# Patient Record
Sex: Female | Born: 1975 | Race: Black or African American | Hispanic: No | Marital: Single | Smoking: Never smoker
Health system: Southern US, Community
[De-identification: ages and names within clinical notes are randomized; demographics above are authoritative.]

## PROBLEM LIST (undated history)

## (undated) DIAGNOSIS — R739 Hyperglycemia, unspecified: Secondary | ICD-10-CM

---

## 2002-12-23 ENCOUNTER — Other Ambulatory Visit: Admission: RE | Admit: 2002-12-23 | Discharge: 2002-12-23 | Payer: Self-pay | Admitting: *Deleted

## 2003-02-11 ENCOUNTER — Observation Stay (HOSPITAL_COMMUNITY): Admission: RE | Admit: 2003-02-11 | Discharge: 2003-02-12 | Payer: Self-pay | Admitting: *Deleted

## 2003-02-11 ENCOUNTER — Encounter (INDEPENDENT_AMBULATORY_CARE_PROVIDER_SITE_OTHER): Payer: Self-pay | Admitting: *Deleted

## 2003-09-15 ENCOUNTER — Other Ambulatory Visit: Admission: RE | Admit: 2003-09-15 | Discharge: 2003-09-15 | Payer: Self-pay | Admitting: *Deleted

## 2004-01-06 ENCOUNTER — Other Ambulatory Visit: Admission: RE | Admit: 2004-01-06 | Discharge: 2004-01-06 | Payer: Self-pay | Admitting: *Deleted

## 2004-09-14 ENCOUNTER — Other Ambulatory Visit: Admission: RE | Admit: 2004-09-14 | Discharge: 2004-09-14 | Payer: Self-pay | Admitting: *Deleted

## 2005-04-14 ENCOUNTER — Other Ambulatory Visit: Admission: RE | Admit: 2005-04-14 | Discharge: 2005-04-14 | Payer: Self-pay | Admitting: *Deleted

## 2006-10-10 ENCOUNTER — Other Ambulatory Visit: Admission: RE | Admit: 2006-10-10 | Discharge: 2006-10-10 | Payer: Self-pay | Admitting: *Deleted

## 2007-09-19 ENCOUNTER — Emergency Department (HOSPITAL_COMMUNITY): Admission: EM | Admit: 2007-09-19 | Discharge: 2007-09-19 | Payer: Self-pay | Admitting: Physician Assistant

## 2010-07-04 ENCOUNTER — Emergency Department (HOSPITAL_COMMUNITY)
Admission: EM | Admit: 2010-07-04 | Discharge: 2010-07-05 | Disposition: A | Discharge status: Other Acute Inpatient Hospital | Payer: BC Managed Care – PPO | Source: Home / Self Care | Attending: Emergency Medicine | Admitting: Emergency Medicine

## 2010-07-04 ENCOUNTER — Encounter (HOSPITAL_COMMUNITY): Payer: Self-pay

## 2010-07-04 ENCOUNTER — Emergency Department (HOSPITAL_COMMUNITY): Payer: BC Managed Care – PPO

## 2010-07-04 DIAGNOSIS — D259 Leiomyoma of uterus, unspecified: Secondary | ICD-10-CM | POA: Insufficient documentation

## 2010-07-04 DIAGNOSIS — R188 Other ascites: Secondary | ICD-10-CM | POA: Insufficient documentation

## 2010-07-04 DIAGNOSIS — R109 Unspecified abdominal pain: Secondary | ICD-10-CM | POA: Insufficient documentation

## 2010-07-04 DIAGNOSIS — N921 Excessive and frequent menstruation with irregular cycle: Secondary | ICD-10-CM | POA: Insufficient documentation

## 2010-07-04 DIAGNOSIS — R310 Gross hematuria: Secondary | ICD-10-CM | POA: Insufficient documentation

## 2010-07-04 DIAGNOSIS — R300 Dysuria: Secondary | ICD-10-CM | POA: Insufficient documentation

## 2010-07-04 DIAGNOSIS — R112 Nausea with vomiting, unspecified: Secondary | ICD-10-CM | POA: Insufficient documentation

## 2010-07-04 DIAGNOSIS — R509 Fever, unspecified: Secondary | ICD-10-CM | POA: Insufficient documentation

## 2010-07-04 DIAGNOSIS — D72829 Elevated white blood cell count, unspecified: Secondary | ICD-10-CM | POA: Insufficient documentation

## 2010-07-04 DIAGNOSIS — R000 Tachycardia, unspecified: Secondary | ICD-10-CM | POA: Insufficient documentation

## 2010-07-04 DIAGNOSIS — N7093 Salpingitis and oophoritis, unspecified: Secondary | ICD-10-CM | POA: Insufficient documentation

## 2010-07-04 LAB — COMPREHENSIVE METABOLIC PANEL
AST: 23 U/L (ref 0–37)
BUN: 11 mg/dL (ref 6–23)
CO2: 21 mEq/L (ref 19–32)
Calcium: 8 mg/dL — ABNORMAL LOW (ref 8.4–10.5)
Creatinine, Ser: 0.68 mg/dL (ref 0.4–1.2)
GFR calc Af Amer: >60 mL/min (ref >60)
GFR calc non Af Amer: >60 mL/min (ref >60)
Glucose, Bld: 100 mg/dL — ABNORMAL HIGH (ref 70–99)

## 2010-07-04 LAB — DIFFERENTIAL
Basophils Absolute: 0 10*3/uL (ref 0.0–0.1)
Basophils Relative: 0 % (ref 0–1)
Eosinophils Relative: 0 % (ref 0–5)
Lymphocytes Relative: 2 % — ABNORMAL LOW (ref 12–46)
Lymphs Abs: 0.3 10*3/uL — ABNORMAL LOW (ref 0.7–4.0)
Neutro Abs: 14.4 10*3/uL — ABNORMAL HIGH (ref 1.7–7.7)
Neutrophils Relative %: 79 % — ABNORMAL HIGH (ref 43–77)
Promyelocytes Absolute: 0 %

## 2010-07-04 LAB — URINALYSIS, ROUTINE W REFLEX MICROSCOPIC
Ketones, ur: 40 mg/dL — AB
Protein, ur: NEGATIVE mg/dL
Urobilinogen, UA: 1 mg/dL (ref 0.0–1.0)

## 2010-07-04 LAB — POCT PREGNANCY, URINE: Preg Test, Ur: NEGATIVE

## 2010-07-04 LAB — CBC
Hemoglobin: 8.4 g/dL — ABNORMAL LOW (ref 12.0–15.0)
MCH: 20 pg — ABNORMAL LOW (ref 26.0–34.0)
MCHC: 31.6 g/dL (ref 30.0–36.0)

## 2010-07-04 LAB — WET PREP, GENITAL
Trich, Wet Prep: NONE SEEN
Yeast Wet Prep HPF POC: NONE SEEN

## 2010-07-04 MED ORDER — IOHEXOL 300 MG/ML  SOLN
100.0000 mL | Freq: Once | INTRAMUSCULAR | Status: AC | PRN
  Administered 2010-07-04: 100 mL via INTRAVENOUS

## 2010-07-05 ENCOUNTER — Inpatient Hospital Stay (HOSPITAL_COMMUNITY)
Admission: AD | Admit: 2010-07-05 | Discharge: 2010-07-13 | DRG: 573 | Disposition: A | Discharge status: Home / Self Care | Payer: BC Managed Care – PPO | Source: Other Acute Inpatient Hospital | Attending: Obstetrics and Gynecology | Admitting: Obstetrics and Gynecology

## 2010-07-05 DIAGNOSIS — N9489 Other specified conditions associated with female genital organs and menstrual cycle: Secondary | ICD-10-CM | POA: Diagnosis present

## 2010-07-05 DIAGNOSIS — D250 Submucous leiomyoma of uterus: Secondary | ICD-10-CM | POA: Diagnosis present

## 2010-07-05 DIAGNOSIS — N7093 Salpingitis and oophoritis, unspecified: Principal | ICD-10-CM | POA: Diagnosis present

## 2010-07-05 DIAGNOSIS — N801 Endometriosis of ovary: Secondary | ICD-10-CM | POA: Diagnosis present

## 2010-07-05 DIAGNOSIS — N739 Female pelvic inflammatory disease, unspecified: Secondary | ICD-10-CM | POA: Diagnosis present

## 2010-07-05 DIAGNOSIS — T8131XA Disruption of external operation (surgical) wound, not elsewhere classified, initial encounter: Secondary | ICD-10-CM | POA: Diagnosis present

## 2010-07-05 DIAGNOSIS — K5900 Constipation, unspecified: Secondary | ICD-10-CM | POA: Diagnosis present

## 2010-07-05 DIAGNOSIS — N80109 Endometriosis of ovary, unspecified side, unspecified depth: Secondary | ICD-10-CM | POA: Diagnosis present

## 2010-07-05 DIAGNOSIS — D649 Anemia, unspecified: Secondary | ICD-10-CM | POA: Diagnosis present

## 2010-07-05 LAB — GC/CHLAMYDIA PROBE AMP, GENITAL: Chlamydia, DNA Probe: NEGATIVE

## 2010-07-05 LAB — CBC
Hemoglobin: 8.2 g/dL — ABNORMAL LOW (ref 12.0–15.0)
MCH: 20 pg — ABNORMAL LOW (ref 26.0–34.0)
MCV: 64.3 fL — ABNORMAL LOW (ref 78.0–100.0)
RBC: 4.09 MIL/uL (ref 3.87–5.11)
WBC: 23.4 10*3/uL — ABNORMAL HIGH (ref 4.0–10.5)

## 2010-07-06 ENCOUNTER — Other Ambulatory Visit: Payer: Self-pay | Admitting: Obstetrics and Gynecology

## 2010-07-06 ENCOUNTER — Inpatient Hospital Stay (HOSPITAL_COMMUNITY): Payer: BC Managed Care – PPO

## 2010-07-06 LAB — COMPREHENSIVE METABOLIC PANEL
AST: 14 U/L (ref 0–37)
Albumin: 2.4 g/dL — ABNORMAL LOW (ref 3.5–5.2)
BUN: 3 mg/dL — ABNORMAL LOW (ref 6–23)
Calcium: 8.2 mg/dL — ABNORMAL LOW (ref 8.4–10.5)
Creatinine, Ser: 0.59 mg/dL (ref 0.4–1.2)
GFR calc Af Amer: >60 mL/min (ref >60)
Total Bilirubin: 0.9 mg/dL (ref 0.3–1.2)
Total Protein: 6.7 g/dL (ref 6.0–8.3)

## 2010-07-06 LAB — CBC
HCT: 28.6 % — ABNORMAL LOW (ref 36.0–46.0)
MCH: 21.9 pg — ABNORMAL LOW (ref 26.0–34.0)
MCV: 65 fL — ABNORMAL LOW (ref 78.0–100.0)
MCV: 67.9 fL — ABNORMAL LOW (ref 78.0–100.0)
Platelets: 273 10*3/uL (ref 150–400)
Platelets: 253 10*3/uL (ref 150–400)
RBC: 3.83 MIL/uL — ABNORMAL LOW (ref 3.87–5.11)
RDW: 18 % — ABNORMAL HIGH (ref 11.5–15.5)
RDW: 19.8 % — ABNORMAL HIGH (ref 11.5–15.5)
WBC: 15 10*3/uL — ABNORMAL HIGH (ref 4.0–10.5)
WBC: 18.6 10*3/uL — ABNORMAL HIGH (ref 4.0–10.5)

## 2010-07-06 LAB — DIFFERENTIAL
Basophils Absolute: 0 10*3/uL (ref 0.0–0.1)
Eosinophils Relative: 1 % (ref 0–5)
Monocytes Absolute: 0.8 10*3/uL (ref 0.1–1.0)
Monocytes Relative: 5 % (ref 3–12)
Neutrophils Relative %: 88 % — ABNORMAL HIGH (ref 43–77)

## 2010-07-06 LAB — DIC (DISSEMINATED INTRAVASCULAR COAGULATION)PANEL
D-Dimer, Quant: 14.18 ug/mL-FEU — ABNORMAL HIGH (ref 0.00–0.48)
Fibrinogen: >800 mg/dL — ABNORMAL HIGH (ref 204–475)
INR: 1.45 (ref 0.00–1.49)
Platelets: 280 10*3/uL (ref 150–400)
Prothrombin Time: 17.8 seconds — ABNORMAL HIGH (ref 11.6–15.2)

## 2010-07-07 LAB — COMPREHENSIVE METABOLIC PANEL
AST: 12 U/L (ref 0–37)
Albumin: 2.1 g/dL — ABNORMAL LOW (ref 3.5–5.2)
Alkaline Phosphatase: 61 U/L (ref 39–117)
Chloride: 107 mEq/L (ref 96–112)
GFR calc Af Amer: >60 mL/min (ref >60)
Potassium: 4.2 mEq/L (ref 3.5–5.1)
Total Bilirubin: 0.8 mg/dL (ref 0.3–1.2)

## 2010-07-07 LAB — DIFFERENTIAL
Eosinophils Absolute: 0 10*3/uL (ref 0.0–0.7)
Eosinophils Relative: 0 % (ref 0–5)
Monocytes Absolute: 0.5 10*3/uL (ref 0.1–1.0)
Neutrophils Relative %: 91 % — ABNORMAL HIGH (ref 43–77)

## 2010-07-07 LAB — CROSSMATCH
ABO/RH(D): O POS
Antibody Screen: NEGATIVE
Unit division: 0

## 2010-07-07 LAB — CBC
Platelets: 262 10*3/uL (ref 150–400)
RBC: 3.84 MIL/uL — ABNORMAL LOW (ref 3.87–5.11)
WBC: 15.5 10*3/uL — ABNORMAL HIGH (ref 4.0–10.5)

## 2010-07-07 NOTE — Op Note (Signed)
NAME:  Diane Chaney, Diane Chaney NO.:  [id number]  MEDICAL RECORD NO.:  [id number]           PATIENT TYPE:  I  LOCATION:  9372                          FACILITY:  [facility]  PHYSICIAN:  Randye Lobo, M.D.   DATE OF BIRTH:  [birth year]  DATE OF PROCEDURE:  07/06/2010 DATE OF DISCHARGE:                              OPERATIVE REPORT   PREOPERATIVE DIAGNOSIS:  Pelvic inflammatory disease with right tuboovarian abscess.  POSTOPERATIVE DIAGNOSES: 1. Pelvic inflammatory disease with right tuboovarian abscess. 2. Pelvic adhesions. 3. Obliterated cul-de-sac. 4. Peritonitis.  PROCEDURES: 1. Exploratory laparotomy. 2. Total abdominal hysterectomy with bilateral salpingo-oophorectomy. 3. Drainage of right tuboovarian abscess and intraperitoneal abscess. 4. Lysis of adhesions. 5. Cystoscopy.  SURGEON:  Randye Lobo, MD  ASSISTANT:  Miguel Aschoff, MD  ANESTHESIA:  General endotracheal.  INTRAVENOUS FLUIDS:  2100 mL of crystalloid.  TRANSFUSIONS:  2 units of packed red blood cells (560 mL).  URINE OUTPUT:  450 mL.  DRAINS:  One JP intraperitoneal drain of the cul-de-sac which was brought out through the vagina and one JP subcutaneous drain brought out through the lower abdominal skin.  INTRAOPERATIVE CULTURES:  Both aerobic and anaerobic cultures were taken of infected fluid from within the pelvis and the right tuboovarian abscess.  COMPLICATIONS:  None.  INDICATIONS FOR PROCEDURE:  The patient is a 35 year old, para 0, African American female who was admitted to the [facility] [place] early in the morning on July 05, 2010, after she presented to [facility] Emergency Department with abdominal pain and vomiting. The patient's history was significant for endometriosis, and several years ago she had undergone an exploratory laparotomy with a right ovarian cystectomy and extensive enterolysis.  CT scan in the emergency department documented a right tuboovarian  abscess and a normal appendix. The patient had an elevated white blood cell count of 15.6 and hemoglobin of 8.4.  The patient was admitted and treated as an inpatient for PID with a right tuboovarian abscess and she was begun on cefoxitin and doxycycline.  Initially, the patient had some improvement of her symptoms and pain, but by April 17, the patient became tachypneic, short of breath and her abdominal exam was exhibiting signs of rigidity and  progression of infection.  She also had spiked a fever  the evening prior to 102.6 degrees Fahrenheit.  Two sets of blood culture were drawn. A chest x-ray on April 17 demonstrated some bilateral  atelectasis with possible early left pneumonia.  The decision was made  to proceed at this time with exploratory laparotomy due to the declining  clinical presentation. She had a consent for an exploratory laparotomy with a right salpingo-oophorectomy and drainage of pelvic abscess along with a possible total abdominal hysterectomy and left salpingo-oophorectomy if necessary.  The patient declines future childbearing and she has agreed to this plan.  She was also consented for transfusion of 2 units of packed red blood cells.  Risks, benefits, and alternatives were discussed with the patient who indicated understanding and wished to proceed with surgery.  FINDINGS:  Surgery demonstrated a 7-8 cm right tuboovarian abscess which did open during the  surgical exploration.  There were multiple adhesions of the bilateral tubes and ovaries to the uterus.  The adnexa regions bilaterally were very distorted.  The cul-de-sac was obliterated with adhesions where the bowel appeared to be drawn up to the posterior lower uterine segment.  There was exudate which covered the upper and lower abdominal surfaces including the bowel.  The appendix was normal.  The small bowel was run and there was no sign of any intestinal disease.  In the upper abdomen, the liver and  gallbladder appeared to be normal. Both kidneys were palpated.  There was evidence of pus in each of the paracolic gutters.  Cystoscopy at the termination of the procedure demonstrated a normal bladder and urethra.  Both of the ureters were patent bilaterally.  The rectosigmoid colon was unremarkable.  SPECIMEN:  The uterus, cervix, bilateral tubes, and ovaries were sent to Pathology.  The patient also had intraperitoneal aerobic and anaerobic cultures from the fluid which was obtained during the initial exploration.  PROCEDURE:  The patient was escorted from her inpatient room down to the operating room.  The patient was just receiving a dose of IV doxycycline in the operating room.  The patient had been previously treated with subcutaneous heparin for DVT prophylaxis.  She did also receive PAS stockings at this time.  General endotracheal anesthesia was induced and the patient was placed in the supine position.  The abdomen and vagina were then sterilely prepped and a Foley catheter was placed.  She was sterilely draped.  A vertical midline incision was created with a scalpel and carried down to the fascia using a scalpel and monopolar cautery for hemostasis.  The fascia was incised vertically.  The parietal peritoneum was entered sharply and the incision was extended cranially and caudally.  Fluid was taken at this time for aerobic and anaerobic cultures.  The pelvis was irrigated and suctioned.  The procedure began with lysis of adhesions of the bilateral adnexal regions to the uterus.  The dissection was bloody and the tissue was all very friable.  The right infundibulopelvic ligament was identified.  It was doubly clamped and divided and then was ligated with sutures of 0 Vicryl.  Eventually, the right round ligament could be identified and it was similarly grasped and was bisected with monopolar cautery.  The dissection continued through the broad ligament very carefully on  this side in order to get down to the level of the uterine arteries.  The same procedure that was performed on the right- hand side was repeated on the left.  Care was taken to stay very high along the infundibulopelvic ligaments in order to avoid any contact with the ureters.  The bladder flap was taken down.  The uterine arteries were very carefully dissected out and were clamped, bisected, and suture ligated with 0 Vicryl.  A supracervical hysterectomy needed to be performed in order to safely remove the fundus to gain access into the posterior cul-de-sac.  This was performed.  The rectovaginal septum was then sharply dissected in order to gain access properly to the cervix posteriorly.  Again, the bladder was further dissected down.  The remaining cardinal ligaments were clamped, divided, and suture ligated with 0 Vicryl.  Eventually, the left uterosacral ligament could be clamped, divided, and sutured. This allowed access into the vaginal cuff.  The cervix was circumscribed with a Jorgenson scissors.  Angle sutures were created with 0 Vicryl bilaterally.  The vaginal cuff was then closed with interrupted sutures of  0 Vicryl, but a small space was left for placement of a JP drain.  The cul-de-sac was then irrigated and suctioned.  The dissection of the rectovaginal septum was reapproximated with a running suture of 2-0 Vicryl.  There was some oozing along the right posterior vagina and this responded to figure-of-eight sutures of 2-0 Vicryl.  The patient was placed in stirrups and cystoscopy was performed at this time after the injection of indigo carmine dye IV. The findings were as noted above.  The abdomen and pelvis were then copiously irrigated with warm irrigation and were suctioned.  All of the pedicles were reexamined at this time and hemostasis was excellent and the abdomen was therefore closed.  The self-retaining retractor and lap pads which had been placed were all  removed.  Instrument counts were correct and the abdomen was closed.  The parietal peritoneum was closed with a running suture of 2-0 Vicryl.  The fascia was closed with a running suture of looped 0 PDS. The subcutaneous layer was then irrigated and suctioned and a JP drain was placed in the subcutaneous layer and brought out through the skin in the left lower quadrant. It was sutured to the skin with 2-0 silk suture. An interrupted layer of 2-0 plain gut suture was then placed in the  subcutaneous layer, and the skin was closed with staples.  A sterile  pressure bandage was placed over the abdominal incision.  The patient was awakened and extubated and escorted to recovery in stable condition.  There were no complications to the procedure.  All needle, instrument, and sponge counts were correct.     Randye Lobo, M.D.     BES/MEDQ  D:  07/06/2010  T:  07/07/2010  Job:  [id number]  Electronically Signed by Conley Simmonds M.D. on 07/07/2010 11:31:37 PM

## 2010-07-08 LAB — DIFFERENTIAL
Basophils Absolute: 0.1 10*3/uL (ref 0.0–0.1)
Eosinophils Absolute: 0.2 10*3/uL (ref 0.0–0.7)
Eosinophils Absolute: 0.4 10*3/uL (ref 0.0–0.7)
Eosinophils Relative: 2 % (ref 0–5)
Lymphocytes Relative: 22 % (ref 12–46)
Lymphs Abs: 2 10*3/uL (ref 0.7–4.0)
Lymphs Abs: 2.5 10*3/uL (ref 0.7–4.0)
Monocytes Absolute: 0.9 10*3/uL (ref 0.1–1.0)
Neutro Abs: 7.3 10*3/uL (ref 1.7–7.7)

## 2010-07-08 LAB — CBC
MCH: 21.6 pg — ABNORMAL LOW (ref 26.0–34.0)
MCHC: 31.6 g/dL (ref 30.0–36.0)
MCHC: 32.1 g/dL (ref 30.0–36.0)
MCV: 66.9 fL — ABNORMAL LOW (ref 78.0–100.0)
Platelets: 287 10*3/uL (ref 150–400)
Platelets: 322 10*3/uL (ref 150–400)
RBC: 3.98 MIL/uL (ref 3.87–5.11)
RDW: 20.2 % — ABNORMAL HIGH (ref 11.5–15.5)
RDW: 20.4 % — ABNORMAL HIGH (ref 11.5–15.5)
WBC: 12.5 10*3/uL — ABNORMAL HIGH (ref 4.0–10.5)

## 2010-07-08 LAB — COMPREHENSIVE METABOLIC PANEL
AST: 20 U/L (ref 0–37)
Albumin: 2.4 g/dL — ABNORMAL LOW (ref 3.5–5.2)
Calcium: 8.3 mg/dL — ABNORMAL LOW (ref 8.4–10.5)
Creatinine, Ser: 0.67 mg/dL (ref 0.4–1.2)
GFR calc Af Amer: >60 mL/min (ref >60)
Total Protein: 6.1 g/dL (ref 6.0–8.3)

## 2010-07-09 LAB — DIFFERENTIAL
Basophils Relative: 1 % (ref 0–1)
Lymphocytes Relative: 18 % (ref 12–46)
Monocytes Relative: 9 % (ref 3–12)
Neutro Abs: 6.6 10*3/uL (ref 1.7–7.7)
Neutrophils Relative %: 68 % (ref 43–77)

## 2010-07-09 LAB — CBC
HCT: 26.6 % — ABNORMAL LOW (ref 36.0–46.0)
MCV: 67 fL — ABNORMAL LOW (ref 78.0–100.0)
Platelets: 323 10*3/uL (ref 150–400)
RBC: 3.97 MIL/uL (ref 3.87–5.11)
WBC: 9.7 10*3/uL (ref 4.0–10.5)

## 2010-07-10 LAB — BODY FLUID CULTURE: Culture: NO GROWTH

## 2010-07-11 LAB — CULTURE, BLOOD (ROUTINE X 2)
Culture  Setup Time: 201204162317
Culture: NO GROWTH

## 2010-07-12 LAB — CBC
HCT: 28.7 % — ABNORMAL LOW (ref 36.0–46.0)
Hemoglobin: 9 g/dL — ABNORMAL LOW (ref 12.0–15.0)
MCHC: 31.4 g/dL (ref 30.0–36.0)
RBC: 4.24 MIL/uL (ref 3.87–5.11)

## 2010-07-12 LAB — BASIC METABOLIC PANEL
CO2: 26 mEq/L (ref 19–32)
Calcium: 8.6 mg/dL (ref 8.4–10.5)
Chloride: 103 mEq/L (ref 96–112)
GFR calc Af Amer: >60 mL/min (ref >60)
Glucose, Bld: 99 mg/dL (ref 70–99)
Sodium: 135 mEq/L (ref 135–145)

## 2010-07-30 ENCOUNTER — Emergency Department (HOSPITAL_COMMUNITY)
Admission: EM | Admit: 2010-07-30 | Discharge: 2010-07-31 | Disposition: A | Discharge status: Home / Self Care | Payer: BC Managed Care – PPO | Attending: Emergency Medicine | Admitting: Emergency Medicine

## 2010-07-30 DIAGNOSIS — K5900 Constipation, unspecified: Secondary | ICD-10-CM | POA: Insufficient documentation

## 2010-07-31 ENCOUNTER — Emergency Department (HOSPITAL_COMMUNITY): Payer: BC Managed Care – PPO

## 2010-07-31 LAB — URINALYSIS, ROUTINE W REFLEX MICROSCOPIC
Glucose, UA: NEGATIVE mg/dL
Hgb urine dipstick: NEGATIVE
Protein, ur: NEGATIVE mg/dL
Specific Gravity, Urine: 1.026 (ref 1.005–1.030)
pH: 5.5 (ref 5.0–8.0)

## 2010-08-06 NOTE — Op Note (Signed)
NAME:  Diane Chaney, Diane Chaney                       ACCOUNT NO.:  [id number]   MEDICAL RECORD NO.:  [id number]                   PATIENT TYPE:  OBV   LOCATION:  0443                                 FACILITY:  [facility]   PHYSICIAN:  Almedia Balls. Fore, M.D.                DATE OF BIRTH:  [birth year]   DATE OF PROCEDURE:  02/11/2003  DATE OF DISCHARGE:                                 OPERATIVE REPORT   PREOPERATIVE DIAGNOSES:  Pelvic mass, probable fibroids, pelvic pain.   POSTOPERATIVE DIAGNOSES:  Right ovarian endometrioma, extensive pelvic  adhesions.   OPERATION:  Exploratory laparotomy with right ovarian cystectomy, extensive  enterolysis.   ANESTHESIA:  General oral tracheal.   SURGEON:  Almedia Balls. Randell Patient, M.D.   FIRST ASSISTANT:  Leona Singleton, M.D.   INDICATIONS FOR PROCEDURE:  The patient is a 35 year old with large pelvic  mass which appears to be closely approximated to the uterus and was felt to  be a myoma. The patient had severe pain on palpation with this mass, and is  admitted for exploratory laparotomy  and probable myomectomy this time. She  has been fully counseled as to the nature of the procedure and the risks  involved to include risk of anesthesia, injury to uterus, tubes, ovaries,  bowel, bladder, blood vessels, ureters, postoperative hemorrhage, infection,  recuperation, possible removal of the uterus, tubes and ovaries. She fully  understands all these considerations and has signed informed consent to  proceed on February 11, 2003.   FINDINGS:  On entry into the abdomen, it was found that the mass was a large  ovarian mass involving the right ovary. There were extensive adhesions  involving loops of bowel to the uterus, tubes, ovaries, and pelvic  peritoneum as well. Exploration of the upper abdomen revealed the lower  liver edge, gallbladder, spleen, kidneys, and periaortic areas to be normal  to palpation and the appendix to be normal to visualization and the  appendix  to be normal to visualization and palpation.   DESCRIPTION OF PROCEDURE:  With the patient under general anesthesia,  prepared and draped in the usual sterile fashion, with a Foley catheter in  the bladder, a lower abdominal transverse incision was made and carried into  the peritoneal cavity. After noting that this was an ovarian mass, pelvic  washings were taken. The mass was elevated out of the pelvis and during this  procedure, the mass was ruptured with a large amount of chocolate material  being extruded. It was felt therefore this was an endometrioma. The area was  lavaged with copious amounts of lactated Ringer's solution, and the cyst  ruptured area was opened further so that the cyst wall could be grasped and  dissected away from the cortex of the ovary. This was accomplished very  carefully and required quite a long time to perform safely. This was  accomplished. Portions of the ovary had to be sacrificed because  they were  quite thin. Moderate to a large amount of hemorrhage was encountered in the  ovarian hilum which responded to over sewing with interrupted figure-of-  eight sutures of #1 chromic catgut. Then the ovarian cortex was removed, and  the ovary was reapproximated and rendered hemostatic with continuous through  and through sutures of 3-0 PDS. The cyst wall incision site was closed with  a baseball type suture of 3-0 PDS. The numerous pelvic adhesions were  excised using sharp dissection and Bovie electrocoagulation. A number of  adhesions on the uterus were likewise removed as well as those on the left  adnexal structures. The area was lavaged with copious amounts of lactated  Ringer's solution, and after noting that hemostasis was maintained and that  sponge and instrument counts were correct, the peritoneum was closed with a  continuous suture of #0 Vicryl. The fascia was closed with two sutures of #0  Vicryl which were brought from the lateral aspect  of the incision and tied  separately in the midline. The subcutaneous fat was reapproximated with  interrupted horizontal mattress sutures of #0 Vicryl. The skin was closed  with a subcuticular suture of 3-0 plain catgut. Estimated blood loss 600 mL.  The patient was taken to the recovery room in good condition with clear  urine and a Foley catheter tubing. She will be placed on 23 hour observation  following surgery.                                               Almedia Balls. Randell Patient, M.D.    SRF/MEDQ  D:  02/11/2003  T:  02/11/2003  Job:  [id number]   cc:   Leona Singleton, M.D.  [address]  [place]  [place] [address]  Fax: [phone]

## 2010-08-06 NOTE — Discharge Summary (Signed)
NAME:  Diane Chaney, Diane Chaney                       ACCOUNT NO.:  [id number]   MEDICAL RECORD NO.:  [id number]                   PATIENT TYPE:  OBV   LOCATION:  0443                                 FACILITY:  [facility]   PHYSICIAN:  Almedia Balls. Fore, M.D.                DATE OF BIRTH:  Sep 12, 1975   DATE OF ADMISSION:  02/11/2003  DATE OF DISCHARGE:  02/12/2003                                 DISCHARGE SUMMARY   HISTORY:  The patient is a 35 year old with abdominopelvic pain and a large  pelvic mass felt to be fibroids.  She was admitted for possible myomectomy  on February 11, 2003.  The remainder of her history and physical are as  previously dictated.   LABORATORY DATA:  Preoperative hemoglobin 12.2.   HOSPITAL COURSE:  The patient was taken to the operating room on the morning  of February 11, 2003, at which time it was found that she had extensive  endometriosis and a large very firm endometrioma of the right ovary.  She  underwent a right ovarian cystectomy and extensive enterolysis and removal  of adhesions.  She did well postoperatively.  Diet and ambulation were  progressed over the evening of November 23 and early morning of February 12, 2003.  On the morning of February 12, 2003, she was afebrile and  experiencing no problems except pain, which was controlled by analgesics.  It was felt that she could be discharged at this time.   FINAL DIAGNOSES:  1. Right ovarian cyst, probable endometrioma.  2. Extensive pelvic adhesions.   OPERATIONS:  Right ovarian cystectomy, extensive enterolysis, pathology  report unavailable at the time of dictation.   DISPOSITION:  Discharged home, to return to the office in two weeks for  follow-up.  She was instructed to gradually progress her activities over  several weeks at home and to limit lifting and driving for two weeks.  She  was fully ambulatory, on a regular diet, and in good condition at the time  of discharge.  She was given a  prescription for [facility] #30 to be  taken 1 or 2 q.4h. p.r.n. pain and doxycycline 100 mg, #12, to be taken 1  b.i.d.                                               Almedia Balls. Randell Patient, M.D.    SRF/MEDQ  D:  02/12/2003  T:  02/12/2003  Job:  [id number]

## 2010-08-06 NOTE — H&P (Signed)
NAME:  Diane Chaney, Diane Chaney NO.:  [id number]   MEDICAL RECORD NO.:  [id number]                   PATIENT TYPE:   LOCATION:                                       FACILITY:   PHYSICIAN:  Almedia Balls. Fore, M.D.                DATE OF BIRTH:   DATE OF ADMISSION:  02/11/2003  DATE OF DISCHARGE:                                HISTORY & PHYSICAL   CHIEF COMPLAINT:  Fibroids, pain, abnormal bleeding.   HISTORY:  The patient is a 35 year old gravida 0, whose last menstrual  period was December 23, 2002.  She was seen in our office initially on December 23, 2002, with complaints of pain and hardness in her stomach.  She also  related severe constipation.  Examination at that time revealed uterus  enlarged to approximately 20+ size which was firm and tender.  This uterus  impinged on the descending rectosigmoid and was felt to be a probable cause  for bowel troubles.  Pap smear done at that time was normal.  CBC revealed  hemoglobin of 11.5, with normal white count, differential.  Quantitative hCG  was negative.  Ultrasound was performed which showed probable myoma present.  Ovaries were normal as best as could be determined.  She is admitted at this  time for exploratory laparotomy, probable myomectomy, possible TAH, BSO.  She has been fully counseled as to the nature of the procedure and the risks  involved, including risk of anesthesia, injury to uterus, tubes, ovaries,  bowel, bladder, blood vessels, ureters, postoperative hemorrhage, infection,  recuperation.  She fully understands all of these considerations and wishes  to proceed on February 11, 2003.   PAST MEDICAL HISTORY:  Essentially negative.  The patient takes no  medications regularly other than non-steroidal anti-inflammatory medications  for pain and birth control pills for control of her abnormal bleeding.   ALLERGIES:  She is allergic to no medications.   FAMILY HISTORY:  Positive only for her mother  having had an aortic valve  replacement because of aortic stenosis.   REVIEW OF SYSTEMS:  HEENT:  Negative.  CARDIORESPIRATORY:  Negative.  GASTROINTESTINAL:  Negative.  GENITOURINARY:  As in present illness.  NEUROMUSCULAR:  Negative.   PHYSICAL EXAMINATION:  VITAL SIGNS:  Height 5 feet 3 inches, weight 156  pounds, blood pressure 92/68, pulse 80, respirations 18.  GENERAL:  A well-developed black female in no acute distress.  HEENT:  Within normal limits.  NECK:  Supple without masses, adenopathy, or bruits.  HEART:  Regular rate and rhythm without murmurs.  LUNGS:  Clear to P&A.  BREASTS:  Sitting and laying without mass.  Axillae negative.  ABDOMEN:  Soft with irregular mass effect to just above the umbilicus which  is tender on palpation.  PELVIC:  External genitalia, Bartholin's, urethra, and Skene's glands within  normal limits.  Vagina is clean.  Cervix is displaced to the right.  Uterus  is mid position approximately 20+ weeks size, irregular, and tender.  It is  not possible to palpate the adnexal structures.  Anterior and posterior cul-  de-sac exam is confirmatory.  EXTREMITIES:  Within normal limits.  CENTRAL NERVOUS SYSTEM:  Grossly intact.  SKIN:  Without suspicious lesions.   IMPRESSION:  1. 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  2. Abnormal uterine bleeding.  3. Anemia secondary to above.   DISPOSITION:  As noted above.                                               Almedia Balls. Randell Patient, M.D.    SRF/MEDQ  D:  02/06/2003  T:  02/06/2003  Job:  [id number]

## 2010-08-28 NOTE — Discharge Summary (Signed)
NAME:  Diane Chaney, Diane Chaney NO.:  [id number]  MEDICAL RECORD NO.:  [id number]           PATIENT TYPE:  LOCATION:                                 FACILITY:  PHYSICIAN:  Randye Lobo, M.D.   DATE OF BIRTH:  1976-03-09  DATE OF ADMISSION: DATE OF DISCHARGE:                              DISCHARGE SUMMARY   ADMISSION DIAGNOSES: 1. Right tubo-ovarian abscess. 2. Anemia. 3. Constipation.  DISCHARGE DIAGNOSES: 1. Right tubo-ovarian abscess with diffuse peritonitis. 2. Status post exploratory laparotomy with total abdominal     hysterectomy and bilateral salpingo-oophorectomy, lysis of     adhesions and drainage of tubo-ovarian and peritoneal abscess,     cystoscopy on July 06, 2010. 3. Anemia.  Status post transfusion of 2 units of packed red blood     cells. 4. Elevated TSH. 5. Partial inferior vertical wound separation. 6. MRSA positive status.  SIGNIFICANT OPERATIONS AND PROCEDURES:  The patient underwent anexploratory laparotomy with total abdominal hysterectomy with bilateral salpingo-oophorectomy, drainage of right tubo-ovarian abscess and intraperitoneal abscess, lysis of adhesions and cystoscopy on July 06, 2010, under the direction of Dr. Conley Simmonds and with the assistance of Dr. Miguel Aschoff.  The patient had intraoperative transfusion of 2 units of packed red blood cells.  ADMISSION HISTORY AND PHYSICAL EXAMINATION:  The patient is a 35 year old, gravida 0 para 0, African American female who presented to [facility] Emergency Department on July 05, 2010, complaining of abdominal pain, nausea, and vomiting which had begun that afternoon.  When the patient presented to the emergency department, she underwent a CT scan which documented a right tubo-ovarian abscess.  On physical exam, the patient was noted to have a temperature of 100.7, blood pressure 92/57, pulse 105, and respiratory rate 18.  Her hemoglobin was 8.4.  A pregnancy test was  negative.  HOSPITAL COURSE:  The patient was admitted early in the morning on July 05, 2010, to receive IV therapy with cefoxitin and oral doxycycline.  By later that afternoon, the patient was feeling somewhat better.  She did report some nausea with doxycycline.  By later that evening, the patient spiked a temperature to 102.6.  Her doxycycline was converted to IV. Blood cultures x2 were taken.  By the morning of July 06, 2010, the patient was tachycardic, tachypneic, and feeling short of breath.  She had decreased respiratory sounds especially in the left lower lung.  Her abdomen demonstrated no bowel sounds and was more firm and generally tender.  Her white blood cell count was 15.0 with a left shift.  A stat chest x-ray documented bilateral atelectasis and possible early left pneumonia.  At this time, the patient was diagnosed with a worsening clinical picture and signs of a peritonitis and plans were made to proceed with an exploratory laparotomy with removal of the tubo-ovarian abscess and possible hysterectomy with removal of tubes and ovaries.  The patient declined future childbearing, and she agreed to the procedure.  Transfusion risk was discussed with the patient as well as the need for a possible hormone therapy post surgery.  The patient underwent the exploratory laparotomy with total  abdominal hysterectomy, bilateral salpingo-oophorectomy, and drainage of extensive abscess noted in the right adnexal region and in the bilateral paracolic gutters and in the upper abdomen.  There were extensive adhesions noted at the time of the procedure and all the tissues were friable and bled easily.  She therefore received 2 units of packed red blood cells intraoperatively.  She had a JP drain placed intraperitoneal which came out through the vagina and a JP subcutaneously which was brought out through the skin lateral to the vertical midline incision.  The patient's surgery was  uncomplicated.  Cystoscopy demonstrated patent ureters bilaterally and a normal bladder and urethra.  Postoperatively, the patient was monitored in the AICU.  The patient was started on imipenem immediately postoperatively.  She responded very quickly to surgical exploration and the IV antibiotic therapy.  All peritoneal cultures and blood cultures ultimately returned negative for any organisms.  PMNs were demonstrated with intraperitoneal fluid collections.  The patient maintained an NG tube and her two JP drains until postoperative day #3, at which time they were discontinued and the patient was found to be stable and ready for transfer to the regular postoperative Womens Unit.  The patient's TSH returned at a level of 11.006.  This will be checked as an outpatient as the patient was acutely ill at the time that it was drawn.  For the remainder portion of the patient's postoperative care, she had slow return of bowel function but is passing gas well at the time of her discharge.  Her diet has been very slowly advanced to normal.  She is tolerating p.o. liquids and solid food without difficulty.  The patient has been ambulating extensively during her postoperative care.  She has received DVT prophylaxis during her hospital stay including initially subcutaneous heparin and then PAS hose.  The patient has converted from a morphine PCA to oral oxycodone and ibuprofen, and these were controlling the patient's pain well.  On the day of intended discharge, the patient's staples were removed and she had an opening of the inferior portion of the vertical midline incision.  There was no drainage noted, the tissue looked healthy.  The remainder of the incision remained intact and was reinforced with Benzoin and Steri-Strips.  Moistened gauze pad was placed in the subcutaneous tissue which palpated to be intact.  The patient was then observed overnight one additional night.  She was begun on  Augmentin orally during the 24 hours prior to her discharge.  The patient's final pathology report demonstrated submucosal leiomyoma, evidence of endosalpingiosis, focal endometriosis of benign ovaries. Fibrous adhesions, serosal acute inflammation, and acute and chronic salpingitis which was unilateral.  Her idscharge hemoglobin was 9.0.  Of note, the patient's MRSA status was positive, and she was treated with isolation precautions during her hospital stay.  A full discussion was carried out with the patient regarding the surgical findings and procedures, she indicates understanding of the reason for both surgery and the hysterectomy.  I have discussed with her also the initiation of estrogen therapy postoperatively and this will be handled as an outpatient.  DISPOSITION:  The patient is found to be in good condition and ready for discharge on postop day #7.  INSTRUCTIONS AT DISCHARGE: 1. Discharged to home. 2. The patient will take the following medications:     a.     Augmentin 875 mg one p.o. b.i.d. x7 days.     b.     Oxycodone 5 mg one to two p.o. q.4-6 h.  p.r.n. pain.     c.     Ibuprofen 600 mg p.o. q.6 h. p.r.n. pain.     d.     Dulcolax stool softener one p.o. daily. 3. The patient will follow a regular diet. 4. The patient will have sedentary activity at home; however, she will     be up and ambulating frequently to minimize risk with deep venous     thrombosis. 5. The patient will shower tomorrow morning and remove her dressing at     that time. 6. The patient will follow up in the office tomorrow morning for     incisional reassessment and a dressing change. 7. The patient will call if she experiences problems with fever, heavy     incisional drainage, bleeding or further separation, heavy vaginal     bleeding, increased pain, nausea and vomiting, or any other concern.     Randye Lobo, M.D.     BES/MEDQ  D:  07/13/2010  T:  07/13/2010  Job:   [id number]  Electronically Signed by Conley Simmonds M.D. on 08/28/2010 01:22:00 PM

## 2015-11-25 ENCOUNTER — Other Ambulatory Visit: Payer: Self-pay | Admitting: Family Medicine

## 2015-11-25 ENCOUNTER — Ambulatory Visit
Admission: RE | Admit: 2015-11-25 | Discharge: 2015-11-25 | Disposition: A | Discharge status: Home / Self Care | Payer: BC Managed Care – PPO | Source: Ambulatory Visit | Attending: Family Medicine | Admitting: Family Medicine

## 2015-11-25 DIAGNOSIS — J069 Acute upper respiratory infection, unspecified: Secondary | ICD-10-CM

## 2017-05-31 ENCOUNTER — Other Ambulatory Visit: Payer: Self-pay | Admitting: Obstetrics & Gynecology

## 2017-05-31 DIAGNOSIS — R928 Other abnormal and inconclusive findings on diagnostic imaging of breast: Secondary | ICD-10-CM

## 2017-06-07 ENCOUNTER — Other Ambulatory Visit: Payer: Self-pay | Admitting: Obstetrics & Gynecology

## 2017-06-07 ENCOUNTER — Ambulatory Visit
Admission: RE | Admit: 2017-06-07 | Discharge: 2017-06-07 | Disposition: A | Discharge status: Home / Self Care | Payer: BC Managed Care – PPO | Source: Ambulatory Visit | Attending: Obstetrics & Gynecology | Admitting: Obstetrics & Gynecology

## 2017-06-07 DIAGNOSIS — R928 Other abnormal and inconclusive findings on diagnostic imaging of breast: Secondary | ICD-10-CM

## 2017-06-07 DIAGNOSIS — N6320 Unspecified lump in the left breast, unspecified quadrant: Secondary | ICD-10-CM

## 2017-11-22 DIAGNOSIS — E669 Obesity, unspecified: Secondary | ICD-10-CM | POA: Diagnosis not present

## 2017-11-22 DIAGNOSIS — E119 Type 2 diabetes mellitus without complications: Secondary | ICD-10-CM

## 2017-12-13 ENCOUNTER — Ambulatory Visit: Payer: BC Managed Care – PPO

## 2017-12-13 ENCOUNTER — Ambulatory Visit
Admission: RE | Admit: 2017-12-13 | Discharge: 2017-12-13 | Disposition: A | Discharge status: Home / Self Care | Payer: BC Managed Care – PPO | Source: Ambulatory Visit | Attending: Obstetrics & Gynecology | Admitting: Obstetrics & Gynecology

## 2017-12-13 DIAGNOSIS — N6320 Unspecified lump in the left breast, unspecified quadrant: Secondary | ICD-10-CM

## 2018-01-03 ENCOUNTER — Other Ambulatory Visit: Payer: Self-pay | Admitting: Nurse Practitioner

## 2018-01-16 ENCOUNTER — Encounter: Payer: Self-pay | Admitting: Nurse Practitioner

## 2018-01-16 DIAGNOSIS — E669 Obesity, unspecified: Secondary | ICD-10-CM | POA: Insufficient documentation

## 2018-01-16 DIAGNOSIS — E119 Type 2 diabetes mellitus without complications: Secondary | ICD-10-CM | POA: Insufficient documentation

## 2018-01-17 ENCOUNTER — Ambulatory Visit: Payer: BC Managed Care – PPO | Admitting: Nurse Practitioner

## 2018-04-07 ENCOUNTER — Other Ambulatory Visit: Payer: Self-pay | Admitting: Nurse Practitioner

## 2018-04-26 ENCOUNTER — Other Ambulatory Visit: Payer: Self-pay | Admitting: Nurse Practitioner

## 2018-04-27 ENCOUNTER — Other Ambulatory Visit: Payer: Self-pay | Admitting: Nurse Practitioner

## 2018-05-31 ENCOUNTER — Other Ambulatory Visit: Payer: Self-pay | Admitting: Obstetrics & Gynecology

## 2018-05-31 DIAGNOSIS — N6320 Unspecified lump in the left breast, unspecified quadrant: Secondary | ICD-10-CM

## 2018-06-12 ENCOUNTER — Ambulatory Visit: Payer: BC Managed Care – PPO

## 2018-06-12 ENCOUNTER — Ambulatory Visit
Admission: RE | Admit: 2018-06-12 | Discharge: 2018-06-12 | Disposition: A | Discharge status: Home / Self Care | Payer: BC Managed Care – PPO | Source: Ambulatory Visit | Attending: Obstetrics & Gynecology | Admitting: Obstetrics & Gynecology

## 2018-06-12 ENCOUNTER — Other Ambulatory Visit: Payer: Self-pay

## 2018-06-12 DIAGNOSIS — N6320 Unspecified lump in the left breast, unspecified quadrant: Secondary | ICD-10-CM

## 2018-12-14 ENCOUNTER — Other Ambulatory Visit: Payer: Self-pay

## 2018-12-14 DIAGNOSIS — Z20822 Contact with and (suspected) exposure to covid-19: Secondary | ICD-10-CM

## 2018-12-15 LAB — NOVEL CORONAVIRUS, NAA: SARS-CoV-2, NAA: NOT DETECTED

## 2018-12-18 ENCOUNTER — Encounter: Payer: Self-pay | Admitting: Nurse Practitioner

## 2018-12-18 ENCOUNTER — Ambulatory Visit (INDEPENDENT_AMBULATORY_CARE_PROVIDER_SITE_OTHER): Payer: BC Managed Care – PPO | Admitting: Nurse Practitioner

## 2018-12-18 ENCOUNTER — Other Ambulatory Visit: Payer: Self-pay

## 2018-12-18 VITALS — BP 110/70 | HR 110 | Temp 99.9°F | Wt 233.0 lb

## 2018-12-18 DIAGNOSIS — E119 Type 2 diabetes mellitus without complications: Secondary | ICD-10-CM

## 2018-12-18 DIAGNOSIS — R112 Nausea with vomiting, unspecified: Secondary | ICD-10-CM | POA: Diagnosis not present

## 2018-12-18 DIAGNOSIS — R519 Headache, unspecified: Secondary | ICD-10-CM

## 2018-12-18 DIAGNOSIS — R51 Headache: Secondary | ICD-10-CM | POA: Diagnosis not present

## 2018-12-18 MED ORDER — ONDANSETRON HCL 4 MG PO TABS: 4.0000 mg | ORAL_TABLET | Freq: Every day | ORAL | 1 refills | Status: DC | PRN

## 2018-12-18 MED ORDER — AZITHROMYCIN 250 MG PO TABS: ORAL_TABLET | ORAL | 0 refills | Status: DC

## 2018-12-18 MED ORDER — ALBUTEROL SULFATE HFA 108 (90 BASE) MCG/ACT IN AERS: 2.0000 | INHALATION_SPRAY | Freq: Four times a day (QID) | RESPIRATORY_TRACT | 1 refills | Status: DC | PRN

## 2018-12-18 NOTE — Progress Notes (Signed)
Subjective:     Patient ID: Diane Chaney , female    DOB: 06-Jul-1975 , 43 y.o.   MRN: [id number]   Chief Complaint  Patient presents with  . Fatigue    patient stated she has not been feeling well, she has had loss of appetite, fatigue, nausea and vomitting patient had covid testing on friday and the results came back neg    HPI  Started with mucous and slight headache on Thursday and on Friday went for Covid testing. She felt better on Sunday went out to a shooting class she went out to the class then on Monday was unable to stand for longer than 30 seconds. She was unable to hold any food down and unable to taste anything. Stomach felt queasy. She had vomiting last night and diarrhea.  Unable to do activity for more than 20 minutes.  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  She lives alone.  She has a cough.      No past medical history on file.   No family history on file.   Current Outpatient Medications:  .  Liraglutide -Weight Management (SAXENDA [place]), Inject into the skin. Inject 3mg  by subcutaneous route every day in the abdomen, thigh, or upper arm., Disp: , Rfl:  .  Multiple Vitamin (MULTIVITAMIN WITH MINERALS) TABS tablet, Take 1 tablet by mouth daily., Disp: , Rfl:    No Known Allergies   Review of Systems  Constitutional: Negative.   Respiratory: Negative.   Cardiovascular: Negative.  Negative for chest pain, palpitations and leg swelling.  Skin: Negative.   Neurological: Negative.  Negative for dizziness, weakness and headaches.  Psychiatric/Behavioral: Negative.      Today's Vitals   12/18/18 1559  BP: 110/70  Pulse: (!) 110  Temp: 99.9 F (37.7 C)  TempSrc: Oral  Weight: 233 lb (105.7 kg)   Body mass index is 41.6 kg/m.   Objective:  Physical Exam Vitals signs reviewed.  Constitutional:      Appearance: Normal appearance.  Cardiovascular:     Rate and Rhythm: Normal rate and regular rhythm.     Pulses: Normal  pulses.     Heart sounds: Normal heart sounds. No murmur.  Pulmonary:     Effort: Pulmonary effort is normal. No respiratory distress.     Breath sounds: Normal breath sounds. No wheezing.     Comments: Cough noted when she takes a deep breath Skin:    Capillary Refill: Capillary refill takes less than 2 seconds.  Neurological:     General: No focal deficit present.     Mental Status: She is alert and oriented to person, place, and time.  Psychiatric:        Mood and Affect: Mood normal.        Behavior: Behavior normal.        Thought Content: Thought content normal.        Judgment: Judgment normal.         Assessment And Plan:      1. Acute nonintractable headache, unspecified headache type  New onset headache  Due to the coronavirus pandemic I will check her for this  She is advised to self isolate  Diff Dx: Coronavirus vs headache vs poorly controlled diabetes - azithromycin (ZITHROMAX) 250 MG tablet; Take 2 tablets (500 mg) on  Day 1,  followed by 1 tablet (250 mg) once daily on Days 2 through 5.  Dispense: 6 each; Refill: 0 -  ondansetron (ZOFRAN) 4 MG tablet; Take 1 tablet (4 mg total) by mouth daily as needed for nausea or vomiting.  Dispense: 30 tablet; Refill: 1 - Novel Coronavirus, NAA ([facility]) - CBC no Diff - CMP14 + Anion Gap  2. Non-intractable vomiting with nausea, unspecified vomiting type  Will treat with zofran  Encouraged to drink clear fluids as tolerated - ondansetron (ZOFRAN) 4 MG tablet; Take 1 tablet (4 mg total) by mouth daily as needed for nausea or vomiting.  Dispense: 30 tablet; Refill: 1 - Novel Coronavirus, NAA ([facility]) - CBC no Diff - CMP14 + Anion Gap  3. Type 2 diabetes mellitus without complication, without long-term current use of insulin (HCC)  She has not been taking any medications for at least a year and a half due to being lost to follow up   Will check her HgbA1c and start medications as necessary - Hemoglobin A1c - Lipid  Profile - Semaglutide,0.25 or 0.5MG /DOS, (OZEMPIC, 0.25 OR 0.5 MG/DOSE,) 2 MG/1.5ML SOPN; Inject 0.5 mg into the skin once a week.  Dispense: 1 pen; Refill: 3 - insulin degludec (TRESIBA FLEXTOUCH) 100 UNIT/ML SOPN FlexTouch Pen; Inject 0.2 mLs (20 Units total) into the skin daily.  Dispense: 5 pen; Refill: 3   [name], FNP    THE PATIENT IS ENCOURAGED TO PRACTICE SOCIAL DISTANCING DUE TO THE COVID-19 PANDEMIC.

## 2018-12-19 ENCOUNTER — Encounter: Payer: Self-pay | Admitting: Nurse Practitioner

## 2018-12-19 LAB — CBC
Hematocrit: 39.1 % (ref 34.0–46.6)
Hemoglobin: 12.6 g/dL (ref 11.1–15.9)
MCH: 22.7 pg — ABNORMAL LOW (ref 26.6–33.0)
MCHC: 32.2 g/dL (ref 31.5–35.7)
MCV: 70 fL — ABNORMAL LOW (ref 79–97)
Platelets: 236 10*3/uL (ref 150–450)
RBC: 5.56 x10E6/uL — ABNORMAL HIGH (ref 3.77–5.28)
RDW: 13.1 % (ref 11.7–15.4)
WBC: 3.8 10*3/uL (ref 3.4–10.8)

## 2018-12-19 LAB — HEMOGLOBIN A1C
Est. average glucose Bld gHb Est-mCnc: 355 mg/dL
Hgb A1c MFr Bld: 14 % — ABNORMAL HIGH (ref 4.8–5.6)

## 2018-12-19 LAB — CMP14 + ANION GAP
ALT: 22 IU/L (ref 0–32)
AST: 30 IU/L (ref 0–40)
Albumin/Globulin Ratio: 1.3 (ref 1.2–2.2)
Albumin: 4.1 g/dL (ref 3.8–4.8)
Alkaline Phosphatase: 85 IU/L (ref 39–117)
Anion Gap: 16 mmol/L (ref 10.0–18.0)
BUN/Creatinine Ratio: 19 (ref 9–23)
BUN: 14 mg/dL (ref 6–24)
Bilirubin Total: 0.2 mg/dL (ref 0.0–1.2)
CO2: 21 mmol/L (ref 20–29)
Calcium: 9.2 mg/dL (ref 8.7–10.2)
Chloride: 96 mmol/L (ref 96–106)
Creatinine, Ser: 0.75 mg/dL (ref 0.57–1.00)
GFR calc Af Amer: 114 mL/min/{1.73_m2} (ref >59)
GFR calc non Af Amer: 99 mL/min/{1.73_m2} (ref >59)
Globulin, Total: 3.2 g/dL (ref 1.5–4.5)
Glucose: 371 mg/dL — ABNORMAL HIGH (ref 65–99)
Potassium: 4 mmol/L (ref 3.5–5.2)
Sodium: 133 mmol/L — ABNORMAL LOW (ref 134–144)
Total Protein: 7.3 g/dL (ref 6.0–8.5)

## 2018-12-19 LAB — LIPID PANEL
Chol/HDL Ratio: 6.3 ratio — ABNORMAL HIGH (ref 0.0–4.4)
Cholesterol, Total: 227 mg/dL — ABNORMAL HIGH (ref 100–199)
HDL: 36 mg/dL — ABNORMAL LOW (ref >39)
LDL Chol Calc (NIH): 158 mg/dL — ABNORMAL HIGH (ref 0–99)
Triglycerides: 181 mg/dL — ABNORMAL HIGH (ref 0–149)
VLDL Cholesterol Cal: 33 mg/dL (ref 5–40)

## 2018-12-19 LAB — NOVEL CORONAVIRUS, NAA: SARS-CoV-2, NAA: NOT DETECTED

## 2018-12-19 MED ORDER — TRESIBA FLEXTOUCH 100 UNIT/ML ~~LOC~~ SOPN: 20.0000 [IU] | PEN_INJECTOR | Freq: Every day | SUBCUTANEOUS | 3 refills | Status: DC

## 2018-12-19 MED ORDER — OZEMPIC (0.25 OR 0.5 MG/DOSE) 2 MG/1.5ML ~~LOC~~ SOPN: 0.5000 mg | PEN_INJECTOR | SUBCUTANEOUS | 3 refills | Status: DC

## 2018-12-20 ENCOUNTER — Telehealth: Payer: Self-pay | Admitting: Nurse Practitioner

## 2018-12-20 DIAGNOSIS — E119 Type 2 diabetes mellitus without complications: Secondary | ICD-10-CM

## 2018-12-20 MED ORDER — RYBELSUS 3 MG PO TABS: 3.0000 mg | ORAL_TABLET | Freq: Every day | ORAL | 0 refills | Status: DC

## 2018-12-20 NOTE — Telephone Encounter (Signed)
NA

## 2018-12-22 ENCOUNTER — Encounter (HOSPITAL_COMMUNITY): Payer: Self-pay | Admitting: *Deleted

## 2018-12-22 ENCOUNTER — Emergency Department (HOSPITAL_COMMUNITY): Payer: BC Managed Care – PPO

## 2018-12-22 ENCOUNTER — Emergency Department (HOSPITAL_COMMUNITY)
Admission: EM | Admit: 2018-12-22 | Discharge: 2018-12-22 | Disposition: A | Discharge status: Home / Self Care | Payer: BC Managed Care – PPO | Attending: Emergency Medicine | Admitting: Emergency Medicine

## 2018-12-22 ENCOUNTER — Other Ambulatory Visit: Payer: Self-pay

## 2018-12-22 DIAGNOSIS — E860 Dehydration: Secondary | ICD-10-CM

## 2018-12-22 DIAGNOSIS — U071 COVID-19: Secondary | ICD-10-CM | POA: Insufficient documentation

## 2018-12-22 DIAGNOSIS — J129 Viral pneumonia, unspecified: Secondary | ICD-10-CM | POA: Diagnosis not present

## 2018-12-22 DIAGNOSIS — E1165 Type 2 diabetes mellitus with hyperglycemia: Secondary | ICD-10-CM | POA: Insufficient documentation

## 2018-12-22 DIAGNOSIS — Z794 Long term (current) use of insulin: Secondary | ICD-10-CM | POA: Diagnosis not present

## 2018-12-22 DIAGNOSIS — J189 Pneumonia, unspecified organism: Secondary | ICD-10-CM

## 2018-12-22 DIAGNOSIS — R739 Hyperglycemia, unspecified: Secondary | ICD-10-CM

## 2018-12-22 DIAGNOSIS — R05 Cough: Secondary | ICD-10-CM | POA: Diagnosis present

## 2018-12-22 HISTORY — DX: Hyperglycemia, unspecified: R73.9

## 2018-12-22 LAB — BASIC METABOLIC PANEL
Anion gap: 15 (ref 5–15)
BUN: 12 mg/dL (ref 6–20)
CO2: 17 mmol/L — ABNORMAL LOW (ref 22–32)
Calcium: 8.6 mg/dL — ABNORMAL LOW (ref 8.9–10.3)
Chloride: 99 mmol/L (ref 98–111)
Creatinine, Ser: 0.7 mg/dL (ref 0.44–1.00)
GFR calc Af Amer: >60 mL/min (ref >60)
GFR calc non Af Amer: >60 mL/min (ref >60)
Glucose, Bld: 412 mg/dL — ABNORMAL HIGH (ref 70–99)
Potassium: 4.2 mmol/L (ref 3.5–5.1)
Sodium: 131 mmol/L — ABNORMAL LOW (ref 135–145)

## 2018-12-22 LAB — URINALYSIS, ROUTINE W REFLEX MICROSCOPIC
Bacteria, UA: NONE SEEN
Bilirubin Urine: NEGATIVE
Glucose, UA: >=500 mg/dL — AB
Hgb urine dipstick: NEGATIVE
Ketones, ur: 20 mg/dL — AB
Leukocytes,Ua: NEGATIVE
Nitrite: NEGATIVE
Protein, ur: NEGATIVE mg/dL
Specific Gravity, Urine: 1.03 (ref 1.005–1.030)
pH: 5 (ref 5.0–8.0)

## 2018-12-22 LAB — I-STAT BETA HCG BLOOD, ED (MC, WL, AP ONLY): I-stat hCG, quantitative: <5 m[IU]/mL (ref <5)

## 2018-12-22 LAB — CBC
HCT: 38.5 % (ref 36.0–46.0)
Hemoglobin: 11.9 g/dL — ABNORMAL LOW (ref 12.0–15.0)
MCH: 22.8 pg — ABNORMAL LOW (ref 26.0–34.0)
MCHC: 30.9 g/dL (ref 30.0–36.0)
MCV: 73.6 fL — ABNORMAL LOW (ref 80.0–100.0)
Platelets: 259 10*3/uL (ref 150–400)
RBC: 5.23 MIL/uL — ABNORMAL HIGH (ref 3.87–5.11)
RDW: 13.2 % (ref 11.5–15.5)
WBC: 9.1 10*3/uL (ref 4.0–10.5)
nRBC: 0 % (ref 0.0–0.2)

## 2018-12-22 LAB — CBG MONITORING, ED
Glucose-Capillary: 384 mg/dL — ABNORMAL HIGH (ref 70–99)
Glucose-Capillary: 236 mg/dL — ABNORMAL HIGH (ref 70–99)

## 2018-12-22 MED ORDER — BENZONATATE 100 MG PO CAPS: 100.0000 mg | ORAL_CAPSULE | Freq: Three times a day (TID) | ORAL | 0 refills | Status: DC | PRN

## 2018-12-22 MED ORDER — LEVOFLOXACIN IN D5W 750 MG/150ML IV SOLN
750.0000 mg | Freq: Once | INTRAVENOUS | Status: AC
  Administered 2018-12-22: 750 mg via INTRAVENOUS
  Filled 2018-12-22: qty 150

## 2018-12-22 MED ORDER — SODIUM CHLORIDE 0.9 % IV BOLUS
1000.0000 mL | Freq: Once | INTRAVENOUS | Status: AC
  Administered 2018-12-22: 1000 mL via INTRAVENOUS

## 2018-12-22 MED ORDER — LEVOFLOXACIN 750 MG PO TABS: 750.0000 mg | ORAL_TABLET | Freq: Every day | ORAL | 0 refills | Status: DC

## 2018-12-22 MED ORDER — LORAZEPAM 0.5 MG PO TABS
0.5000 mg | ORAL_TABLET | Freq: Once | ORAL | Status: AC
  Administered 2018-12-22: 0.5 mg via ORAL
  Filled 2018-12-22: qty 1

## 2018-12-22 NOTE — ED Notes (Signed)
Pt ambulatory to bathroom

## 2018-12-22 NOTE — ED Notes (Signed)
Pt d/c home per MD order. Discharge summary reviewed with pt, pt verbalizes understanding. Ambulatory. Discharged home with visitor.

## 2018-12-22 NOTE — ED Notes (Addendum)
Pt appears to be anxious about discharge home, pt had small emesis episode. Visitor remains at bedside.  EDP made aware, ordered to hold on discharge for now and give 0.5mg  Ativan PO.

## 2018-12-22 NOTE — ED Notes (Signed)
EDP at bedside  

## 2018-12-22 NOTE — ED Notes (Signed)
Pt requesting to speak to MD prior to discharge r/t discharge, MD made aware.

## 2018-12-22 NOTE — ED Triage Notes (Signed)
Pt recently dx with diabetes, this morning dizzy and nauseated, CBG with EMS 466. Took 20 units of insulin then noted CBG 499. 120/80-94%-18 126

## 2018-12-22 NOTE — ED Provider Notes (Signed)
[facility] DEPT Provider Note   CSN: [id number] Arrival date & time: 12/22/18  1208     History   Chief Complaint Chief Complaint  Patient presents with   Hyperglycemia    HPI Diane Chaney is a 43 y.o. female.     HPI Patient saw her primary provider recently for URI symptoms.  Was placed on azithromycin.  Patient also has uncontrolled type 2 diabetes and was started on insulin.  Patient states that her URI symptoms have improved though she still has a cough when she takes a deep breath.  This is nonproductive.  States that she has been having some lower back pain at night which does radiate into her legs.  Denies any abdominal pain.  Denies chest pain.  States she does have tremulous episodes in the morning that she describes as an "out of body experience".  States these have began after she started taking the azithromycin.  She also states that she has decreased appetite in the morning and difficulty sleeping.  She currently denies any headache.  She has no neck pain or stiffness.  She has no focal weakness or numbness.  She has episodic lightheadedness but denies spinning dizziness.  States that she drinks water constantly and urinates frequently but this is unchanged.  Patient had low-grade fever 1 day this week.  Tested negative for coronavirus 5 days ago. Past Medical History:  Diagnosis Date   Hyperglycemia     Patient Active Problem List   Diagnosis Date Noted   Type 2 diabetes mellitus without complication ([place]) 84/16/6063   Obesity 01/16/2018    History reviewed. No pertinent surgical history.   OB History   No obstetric history on file.      Home Medications    Prior to Admission medications   Medication Sig Start Date End Date Taking? Authorizing Provider  acetaminophen (TYLENOL) 500 MG tablet Take 1,000 mg by mouth every 6 (six) hours as needed for moderate pain.   Yes [provider]  insulin degludec  (TRESIBA FLEXTOUCH) 100 UNIT/ML SOPN FlexTouch Pen Inject 0.2 mLs (20 Units total) into the skin daily. 12/19/18  Yes [name], FNP  Multiple Vitamin (MULTIVITAMIN WITH MINERALS) TABS tablet Take 1 tablet by mouth daily.   Yes [provider]  ondansetron (ZOFRAN) 4 MG tablet Take 1 tablet (4 mg total) by mouth daily as needed for nausea or vomiting. 12/18/18 12/18/19 Yes [name], FNP  Semaglutide (RYBELSUS) 3 MG TABS Take 3 mg by mouth daily. 30 minutes before breakfast 12/20/18  Yes [name], FNP  albuterol (VENTOLIN HFA) 108 (90 Base) MCG/ACT inhaler Inhale 2 puffs into the lungs every 6 (six) hours as needed for wheezing or shortness of breath. 12/18/18   [name], FNP  benzonatate (TESSALON) 100 MG capsule Take 1 capsule (100 mg total) by mouth 3 (three) times daily as needed for cough. 12/22/18   Julianne Rice, MD  blood glucose meter kit and supplies KIT Dispense based on patient and insurance preference. Use up to four times daily as directed. (FOR ICD-9 250.00, 250.01). 12/26/18   [name], FNP  levofloxacin (LEVAQUIN) 750 MG tablet Take 1 tablet (750 mg total) by mouth daily. X 7 days 12/23/18   Julianne Rice, MD    Family History No family history on file.  Social History Social History   Tobacco Use   Smoking status: Never Smoker   Smokeless tobacco: Never Used  Substance Use Topics   Alcohol use:  Never    Frequency: Never   Drug use: Never     Allergies   Patient has no known allergies.   Review of Systems Review of Systems  Constitutional: Positive for fever. Negative for chills.  HENT: Negative for congestion, sinus pressure, sore throat and trouble swallowing.   Eyes: Negative for visual disturbance.  Respiratory: Positive for cough. Negative for chest tightness and shortness of breath.   Cardiovascular: Negative for chest pain.  Gastrointestinal: Negative for abdominal pain, constipation, diarrhea, nausea and vomiting.    Endocrine: Positive for polydipsia and polyuria.  Genitourinary: Positive for frequency. Negative for difficulty urinating, dysuria, flank pain and pelvic pain.  Musculoskeletal: Positive for back pain and myalgias. Negative for neck pain and neck stiffness.  Skin: Negative for rash and wound.  Neurological: Positive for tremors and light-headedness. Negative for dizziness, syncope, weakness, numbness and headaches.  Psychiatric/Behavioral: Positive for sleep disturbance.  All other systems reviewed and are negative.    Physical Exam Updated Vital Signs BP 139/84 (BP Location: Right Arm)    Pulse 95    Temp 99.8 F (37.7 C) (Oral)    Resp 20    Ht '5\' 3"'  (1.6 m)    Wt 98.9 kg    LMP 06/22/2010    SpO2 100%    BMI 38.62 kg/m   Physical Exam Vitals signs and nursing note reviewed.  Constitutional:      General: She is not in acute distress.    Appearance: Normal appearance. She is well-developed. She is not ill-appearing.  HENT:     Head: Normocephalic and atraumatic.     Nose: Congestion present.     Comments: Right mucosal edema greater than left.  No sinus tenderness to percussion.    Mouth/Throat:     Mouth: Mucous membranes are moist.     Pharynx: No oropharyngeal exudate or posterior oropharyngeal erythema.  Eyes:     Extraocular Movements: Extraocular movements intact.     Conjunctiva/sclera: Conjunctivae normal.     Pupils: Pupils are equal, round, and reactive to light.  Neck:     Musculoskeletal: Normal range of motion and neck supple. No neck rigidity or muscular tenderness.     Comments: No meningismus Cardiovascular:     Rate and Rhythm: Regular rhythm. Tachycardia present.     Heart sounds: No murmur. No friction rub. No gallop.   Pulmonary:     Breath sounds: Normal breath sounds.     Comments: Mild tachypnea.  Patient has difficulty taking a full deep breath without coughing.  No definite wheezing appreciated. Abdominal:     General: Bowel sounds are normal.  There is no distension.     Palpations: Abdomen is soft. There is no mass.     Tenderness: There is no abdominal tenderness. There is no right CVA tenderness, left CVA tenderness, guarding or rebound.     Hernia: No hernia is present.  Musculoskeletal: Normal range of motion.        General: No swelling, tenderness, deformity or signs of injury.     Right lower leg: No edema.     Left lower leg: No edema.     Comments: No midline thoracic or lumbar tenderness to palpation.  No CVA tenderness.  No lower extremity swelling, asymmetry or tenderness.  Distal pulses intact.  Lymphadenopathy:     Cervical: No cervical adenopathy.  Skin:    General: Skin is warm and dry.     Capillary Refill: Capillary refill takes less than  2 seconds.     Findings: No erythema or rash.  Neurological:     General: No focal deficit present.     Mental Status: She is alert and oriented to person, place, and time.     Comments: Patient is alert and oriented x3 with clear, goal oriented speech. Patient has 5/5 motor in all extremities. Sensation is intact to light touch. Bilateral finger-to-nose is normal with no signs of dysmetria.   Psychiatric:        Behavior: Behavior normal.      ED Treatments / Results  Labs (all labs ordered are listed, but only abnormal results are displayed) Labs Reviewed  SARS CORONAVIRUS 2 (TAT 6-24 HRS) - Abnormal; Notable for the following components:      Result Value   SARS Coronavirus 2 POSITIVE (*)    All other components within normal limits  BASIC METABOLIC PANEL - Abnormal; Notable for the following components:   Sodium 131 (*)    CO2 17 (*)    Glucose, Bld 412 (*)    Calcium 8.6 (*)    All other components within normal limits  CBC - Abnormal; Notable for the following components:   RBC 5.23 (*)    Hemoglobin 11.9 (*)    MCV 73.6 (*)    MCH 22.8 (*)    All other components within normal limits  URINALYSIS, ROUTINE W REFLEX MICROSCOPIC - Abnormal; Notable for  the following components:   Glucose, UA >=500 (*)    Ketones, ur 20 (*)    All other components within normal limits  CBG MONITORING, ED - Abnormal; Notable for the following components:   Glucose-Capillary 384 (*)    All other components within normal limits  CBG MONITORING, ED - Abnormal; Notable for the following components:   Glucose-Capillary 236 (*)    All other components within normal limits  I-STAT BETA HCG BLOOD, ED ([facility], [facility], [facility] ONLY)    EKG None  Radiology No results found.  Procedures Procedures (including critical care time)  Medications Ordered in ED Medications  sodium chloride 0.9 % bolus 1,000 mL (0 mLs Intravenous Stopped 12/22/18 1500)  sodium chloride 0.9 % bolus 1,000 mL (0 mLs Intravenous Stopped 12/22/18 1700)  levofloxacin (LEVAQUIN) IVPB 750 mg (0 mg Intravenous Stopped 12/22/18 1710)  LORazepam (ATIVAN) tablet 0.5 mg (0.5 mg Oral Given 12/22/18 1838)     Initial Impression / Assessment and Plan / ED Course  I have reviewed the triage vital signs and the nursing notes.  Pertinent labs & imaging results that were available during my care of the patient were reviewed by me and considered in my medical decision making (see chart for details).       Appears to have pneumonia on chest x-ray.  Blood sugars improved with IV fluids.  COVID testing has been sent.  Patient peers to have some baseline anxiety.  Given low-dose of Ativan with significant improvement.  Strict return precautions have been given.  Final Clinical Impressions(s) / ED Diagnoses   Final diagnoses:  Community acquired pneumonia, unspecified laterality  Hyperglycemia  Dehydration    ED Discharge Orders         Ordered    levofloxacin (LEVAQUIN) 750 MG tablet  Daily     12/22/18 1739    benzonatate (TESSALON) 100 MG capsule  3 times daily PRN     12/22/18 1739           Julianne Rice, MD 12/26/18 2114

## 2018-12-23 ENCOUNTER — Encounter: Payer: Self-pay | Admitting: Nurse Practitioner

## 2018-12-23 LAB — SARS CORONAVIRUS 2 (TAT 6-24 HRS): SARS Coronavirus 2: POSITIVE — AB

## 2018-12-23 MED ORDER — BLOOD GLUCOSE MONITOR KIT: PACK | 0 refills | Status: DC

## 2018-12-24 ENCOUNTER — Telehealth: Payer: Self-pay

## 2018-12-24 NOTE — Telephone Encounter (Signed)
Patient stated she is feeling ok she is eating a little better but she is still feeling really weak and having a cough and SOB. YRL,RMA

## 2018-12-25 ENCOUNTER — Encounter: Payer: BC Managed Care – PPO | Admitting: Nurse Practitioner

## 2018-12-26 ENCOUNTER — Encounter: Payer: Self-pay | Admitting: Nurse Practitioner

## 2018-12-26 ENCOUNTER — Other Ambulatory Visit: Payer: Self-pay

## 2018-12-26 DIAGNOSIS — E119 Type 2 diabetes mellitus without complications: Secondary | ICD-10-CM

## 2018-12-26 MED ORDER — BLOOD GLUCOSE MONITOR KIT: PACK | 0 refills | Status: DC

## 2019-01-04 ENCOUNTER — Other Ambulatory Visit: Payer: Self-pay

## 2019-01-04 DIAGNOSIS — Z20822 Contact with and (suspected) exposure to covid-19: Secondary | ICD-10-CM

## 2019-01-06 LAB — NOVEL CORONAVIRUS, NAA: SARS-CoV-2, NAA: NOT DETECTED

## 2019-01-07 ENCOUNTER — Encounter: Payer: Self-pay | Admitting: Nurse Practitioner

## 2019-01-07 ENCOUNTER — Telehealth: Payer: Self-pay

## 2019-01-07 NOTE — Telephone Encounter (Signed)
Pt LVM stating that there was suppose to be a letter from you to her employer stating that because of her health concerns she would need to work from home virtually. She would need to turn that letter in to her employer tomorrow 01/08/19 [phone]

## 2019-01-08 ENCOUNTER — Encounter: Payer: Self-pay | Admitting: Nurse Practitioner

## 2019-01-17 ENCOUNTER — Other Ambulatory Visit: Payer: Self-pay | Admitting: Nurse Practitioner

## 2019-01-17 DIAGNOSIS — E119 Type 2 diabetes mellitus without complications: Secondary | ICD-10-CM

## 2019-01-17 MED ORDER — RYBELSUS 7 MG PO TABS: 1.0000 | ORAL_TABLET | Freq: Every day | ORAL | 0 refills | Status: DC

## 2019-01-29 ENCOUNTER — Encounter: Payer: Self-pay | Admitting: Nurse Practitioner

## 2019-01-29 ENCOUNTER — Ambulatory Visit (INDEPENDENT_AMBULATORY_CARE_PROVIDER_SITE_OTHER): Payer: BC Managed Care – PPO | Admitting: Nurse Practitioner

## 2019-01-29 VITALS — BP 126/80 | HR 97 | Temp 98.9°F | Ht 63.6 in | Wt 215.6 lb

## 2019-01-29 DIAGNOSIS — K5909 Other constipation: Secondary | ICD-10-CM | POA: Diagnosis not present

## 2019-01-29 DIAGNOSIS — Z8619 Personal history of other infectious and parasitic diseases: Secondary | ICD-10-CM | POA: Diagnosis not present

## 2019-01-29 DIAGNOSIS — Z8616 Personal history of COVID-19: Secondary | ICD-10-CM

## 2019-01-29 DIAGNOSIS — E119 Type 2 diabetes mellitus without complications: Secondary | ICD-10-CM | POA: Diagnosis not present

## 2019-01-29 MED ORDER — PEN NEEDLES 32G X 6 MM MISC: 1.0000 | Freq: Every day | 3 refills | Status: DC

## 2019-01-29 NOTE — Progress Notes (Addendum)
Subjective:     Patient ID: Diane Chaney , female    DOB: 05/21/1975 , 43 y.o.   MRN: [id number]   Chief Complaint  Patient presents with  . Diabetes    HPI  She has been exercising more at least 2 times a week.    She is working from home.    Wt Readings from Last 3 Encounters: 01/29/19 : 215 lb 9.6 oz (97.8 kg) 12/22/18 : 218 lb (98.9 kg) 12/18/18 : 233 lb (105.7 kg)   Diabetes She presents for her follow-up diabetic visit. She has type 2 diabetes mellitus. Her disease course has been improving. There are no hypoglycemic associated symptoms. Pertinent negatives for hypoglycemia include no dizziness or headaches. Pertinent negatives for diabetes include no blurred vision, no chest pain, no fatigue, no polydipsia, no polyphagia and no polyuria. She is following a generally healthy diet. She has not had a previous visit with a dietitian. She rarely participates in exercise. (Her blood sugars are 90-100 when fasting in morning. And 2 hours after eating averaging 140-160.  ) She does not see a podiatrist.Eye exam is not current.  Constipation This is a new problem. The current episode started more than 1 month ago. The problem has been rapidly worsening since onset. The patient is not on a high fiber diet. She does not exercise regularly. There has not been adequate water intake. Pertinent negatives include no abdominal pain or flatus. She has tried enemas, laxatives and stool softeners (today used an enema) for the symptoms. The treatment provided moderate relief. There is no history of abdominal surgery.     Past Medical History:  Diagnosis Date  . Hyperglycemia      No family history on file.   Current Outpatient Medications:  .  acetaminophen (TYLENOL) 500 MG tablet, Take 1,000 mg by mouth every 6 (six) hours as needed for moderate pain., Disp: , Rfl:  .  blood glucose meter kit and supplies KIT, Dispense based on patient and insurance preference. Use up to four times  daily as directed. (FOR ICD-9 250.00, 250.01)., Disp: 1 each, Rfl: 0 .  insulin degludec (TRESIBA FLEXTOUCH) 100 UNIT/ML SOPN FlexTouch Pen, Inject 0.2 mLs (20 Units total) into the skin daily., Disp: 5 pen, Rfl: 3 .  Multiple Vitamin (MULTIVITAMIN WITH MINERALS) TABS tablet, Take 1 tablet by mouth daily., Disp: , Rfl:  .  Semaglutide (RYBELSUS) 7 MG TABS, Take 1 tablet by mouth daily. 30 minutes before breakfast, Disp: 30 tablet, Rfl: 0 .  albuterol (VENTOLIN HFA) 108 (90 Base) MCG/ACT inhaler, Inhale 2 puffs into the lungs every 6 (six) hours as needed for wheezing or shortness of breath. (Patient not taking: Reported on 01/29/2019), Disp: 18 g, Rfl: 1 .  benzonatate (TESSALON) 100 MG capsule, Take 1 capsule (100 mg total) by mouth 3 (three) times daily as needed for cough. (Patient not taking: Reported on 01/29/2019), Disp: 21 capsule, Rfl: 0 .  levofloxacin (LEVAQUIN) 750 MG tablet, Take 1 tablet (750 mg total) by mouth daily. X 7 days (Patient not taking: Reported on 01/29/2019), Disp: 7 tablet, Rfl: 0 .  ondansetron (ZOFRAN) 4 MG tablet, Take 1 tablet (4 mg total) by mouth daily as needed for nausea or vomiting. (Patient not taking: Reported on 01/29/2019), Disp: 30 tablet, Rfl: 1   No Known Allergies   Review of Systems  Constitutional: Negative for fatigue.  Eyes: Negative for blurred vision.  Respiratory: Negative.   Cardiovascular: Negative.  Negative for chest pain, palpitations   and leg swelling.  Gastrointestinal: Positive for constipation. Negative for abdominal pain and flatus.  Endocrine: Negative for polydipsia, polyphagia and polyuria.  Neurological: Negative for dizziness and headaches.  Psychiatric/Behavioral: Negative.      Today's Vitals   01/29/19 1511  BP: 126/80  Pulse: 97  Temp: 98.9 F (37.2 C)  TempSrc: Oral  Weight: 215 lb 9.6 oz (97.8 kg)  Height: 5' 3.6" (1.615 m)  PainSc: 0-No pain   Body mass index is 37.47 kg/m.   Objective:  Physical  Exam Constitutional:      General: She is not in acute distress.    Appearance: Normal appearance.  Cardiovascular:     Rate and Rhythm: Normal rate and regular rhythm.     Pulses: Normal pulses.     Heart sounds: Normal heart sounds. No murmur.  Pulmonary:     Effort: Pulmonary effort is normal.     Breath sounds: Normal breath sounds.  Skin:    Capillary Refill: Capillary refill takes less than 2 seconds.  Neurological:     General: No focal deficit present.     Mental Status: She is alert and oriented to person, place, and time.  Psychiatric:        Mood and Affect: Mood normal.        Behavior: Behavior normal.        Thought Content: Thought content normal.        Judgment: Judgment normal.         Assessment And Plan:     1. Type 2 diabetes mellitus without complication, without long-term current use of insulin (HCC)  Her blood sugars are improving  Tolerating tresiba and Rybelsus well, will increase Rybelsus to 14 mg daily  Will check HgbA1c - Hemoglobin A1c - CMP14+EGFR - Ambulatory referral to Ophthalmology - Semaglutide (RYBELSUS) 14 MG TABS; Take 1 tablet by mouth daily. Take 30 minutes before breakfast  Dispense: 90 tablet; Refill: 1  2. History of 2019 novel coronavirus disease (COVID-19)  She is negative for coronavirus as of January 04, 2019.  She feels her energy level is improving  3. Other constipation  New onset, may be related to rybelsus  She is advised to take a stool softner and miralax as needed  Diane Moore, FNP    THE PATIENT IS ENCOURAGED TO PRACTICE SOCIAL DISTANCING DUE TO THE COVID-19 PANDEMIC.   

## 2019-01-29 NOTE — Patient Instructions (Signed)
Take benefiber 1 tsp daily in juice or water

## 2019-01-30 LAB — CMP14+EGFR
ALT: 9 IU/L (ref 0–32)
AST: 10 IU/L (ref 0–40)
Albumin/Globulin Ratio: 1.5 (ref 1.2–2.2)
Albumin: 4.4 g/dL (ref 3.8–4.8)
Alkaline Phosphatase: 75 IU/L (ref 39–117)
BUN/Creatinine Ratio: 15 (ref 9–23)
BUN: 10 mg/dL (ref 6–24)
Bilirubin Total: <0.2 mg/dL (ref 0.0–1.2)
CO2: 20 mmol/L (ref 20–29)
Calcium: 9.3 mg/dL (ref 8.7–10.2)
Chloride: 103 mmol/L (ref 96–106)
Creatinine, Ser: 0.65 mg/dL (ref 0.57–1.00)
GFR calc Af Amer: 127 mL/min/{1.73_m2} (ref >59)
GFR calc non Af Amer: 110 mL/min/{1.73_m2} (ref >59)
Globulin, Total: 3 g/dL (ref 1.5–4.5)
Glucose: 114 mg/dL — ABNORMAL HIGH (ref 65–99)
Potassium: 4.1 mmol/L (ref 3.5–5.2)
Sodium: 139 mmol/L (ref 134–144)
Total Protein: 7.4 g/dL (ref 6.0–8.5)

## 2019-01-30 LAB — HEMOGLOBIN A1C
Est. average glucose Bld gHb Est-mCnc: 275 mg/dL
Hgb A1c MFr Bld: 11.2 % — ABNORMAL HIGH (ref 4.8–5.6)

## 2019-01-30 MED ORDER — RYBELSUS 14 MG PO TABS: 1.0000 | ORAL_TABLET | Freq: Every day | ORAL | 1 refills | Status: DC

## 2019-02-19 NOTE — Addendum Note (Signed)
Addended by: [name] F on: 02/19/2019 12:09 AM   Modules accepted: Level of Service

## 2019-02-26 ENCOUNTER — Other Ambulatory Visit: Payer: Self-pay

## 2019-02-26 DIAGNOSIS — E119 Type 2 diabetes mellitus without complications: Secondary | ICD-10-CM

## 2019-02-26 MED ORDER — RYBELSUS 14 MG PO TABS: 1.0000 | ORAL_TABLET | Freq: Every day | ORAL | 1 refills | Status: DC

## 2019-03-26 LAB — HM DIABETES EYE EXAM

## 2019-04-02 ENCOUNTER — Encounter: Payer: Self-pay | Admitting: Nurse Practitioner

## 2019-04-04 ENCOUNTER — Telehealth: Payer: Self-pay

## 2019-04-04 NOTE — Telephone Encounter (Signed)
Patient called stated the rybelsus is making her constipated she is now having to do enemas just to have a BM. I returned her call and left her a v/m letting her know that she can stop the rybelsus but make sure she continues the tresiba. YRL,RMA

## 2019-05-01 ENCOUNTER — Ambulatory Visit: Payer: BC Managed Care – PPO | Admitting: Nurse Practitioner

## 2019-05-01 ENCOUNTER — Other Ambulatory Visit: Payer: Self-pay

## 2019-05-01 ENCOUNTER — Encounter: Payer: Self-pay | Admitting: Nurse Practitioner

## 2019-05-01 VITALS — BP 132/80 | HR 92 | Temp 98.3°F | Ht 63.6 in | Wt 214.0 lb

## 2019-05-01 DIAGNOSIS — Z23 Encounter for immunization: Secondary | ICD-10-CM | POA: Diagnosis not present

## 2019-05-01 DIAGNOSIS — E119 Type 2 diabetes mellitus without complications: Secondary | ICD-10-CM | POA: Diagnosis not present

## 2019-05-01 DIAGNOSIS — Z0000 Encounter for general adult medical examination without abnormal findings: Secondary | ICD-10-CM | POA: Diagnosis not present

## 2019-05-01 LAB — POCT URINALYSIS DIPSTICK
Bilirubin, UA: NEGATIVE
Blood, UA: NEGATIVE
Glucose, UA: NEGATIVE
Ketones, UA: NEGATIVE
Leukocytes, UA: NEGATIVE
Nitrite, UA: NEGATIVE
Protein, UA: NEGATIVE
Spec Grav, UA: >=1.03 — AB (ref 1.010–1.025)
Urobilinogen, UA: 0.2 E.U./dL
pH, UA: 5.5 (ref 5.0–8.0)

## 2019-05-01 LAB — POCT UA - MICROALBUMIN
Albumin/Creatinine Ratio, Urine, POC: <30
Creatinine, POC: 100 mg/dL
Microalbumin Ur, POC: 10 mg/L

## 2019-05-01 MED ORDER — TETANUS-DIPHTH-ACELL PERTUSSIS 5-2.5-18.5 LF-MCG/0.5 IM SUSP
0.5000 mL | Freq: Once | INTRAMUSCULAR | Status: AC
  Administered 2019-05-01: 0.5 mL via INTRAMUSCULAR

## 2019-05-01 NOTE — Progress Notes (Signed)
This visit occurred during the SARS-CoV-2 public health emergency.  Safety protocols were in place, including screening questions prior to the visit, additional usage of staff PPE, and extensive cleaning of exam room while observing appropriate contact time as indicated for disinfecting solutions.  Subjective:     Patient ID: Diane Chaney , female    DOB: [birth year] , 44 y.o.   MRN: [id number]   Chief Complaint  Patient presents with  . Annual Exam    HPI  Here for HM     Diabetes She presents for her follow-up diabetic visit. She has type 2 diabetes mellitus. Her disease course has been improving. There are no hypoglycemic associated symptoms. Pertinent negatives for hypoglycemia include no dizziness or headaches. Pertinent negatives for diabetes include no blurred vision, no chest pain, no fatigue, no polydipsia, no polyphagia and no polyuria. She is following a generally unhealthy diet. She has not had a previous visit with a dietitian. She rarely participates in exercise. (Today was 170.  - she stopped the rybelsus.  She feels as soon as she stopped she began having bowel movements again.  She increased the fiber and water and did not get better.) She does not see a podiatrist.Eye exam is not current.  Constipation This is a new problem. The current episode started more than 1 month ago. The problem has been rapidly worsening since onset. The patient is not on a high fiber diet. She does not exercise regularly. There has not been adequate water intake. Pertinent negatives include no abdominal pain or flatus. Risk factors include obesity. She has tried enemas, laxatives and stool softeners (today used an enema) for the symptoms. The treatment provided moderate relief. There is no history of abdominal surgery.     The patient states she is status post hysterectomy for birth control.  Mammogram last done 06/12/2018 Negative for: breast discharge, breast lump(s), breast pain and breast  self exam.  Pertinent negatives include abnormal bleeding (hematology), anxiety, decreased libido, depression, difficulty falling sleep, dyspareunia, history of infertility, nocturia, sexual dysfunction, sleep disturbances, urinary incontinence, urinary urgency, vaginal discharge and vaginal itching. Diet regular.  The patient states her exercise level is minimal. - she states it took her until January to feel better.  She is getting in more steps with working  The patient's tobacco use is:  Social History   Tobacco Use  Smoking Status Never Smoker  Smokeless Tobacco Never Used   She has been exposed to passive smoke. The patient's alcohol use is:  Social History   Substance and Sexual Activity  Alcohol Use Never   Additional information: Last pap - hysterectomy  Past Medical History:  Diagnosis Date  . Hyperglycemia      No family history on file.   Current Outpatient Medications:  .  acetaminophen (TYLENOL) 500 MG tablet, Take 1,000 mg by mouth every 6 (six) hours as needed for moderate pain., Disp: , Rfl:  .  blood glucose meter kit and supplies KIT, Dispense based on patient and insurance preference. Use up to four times daily as directed. (FOR ICD-9 250.00, 250.01)., Disp: 1 each, Rfl: 0 .  insulin degludec (TRESIBA FLEXTOUCH) 100 UNIT/ML SOPN FlexTouch Pen, Inject 0.2 mLs (20 Units total) into the skin daily., Disp: 5 pen, Rfl: 3 .  Insulin Pen Needle (PEN NEEDLES) 32G X 6 MM MISC, 1 each by Does not apply route daily., Disp: 100 each, Rfl: 3 .  Multiple Vitamin (MULTIVITAMIN WITH MINERALS) TABS tablet, Take 1 tablet by  mouth daily., Disp: , Rfl:  .  Semaglutide (RYBELSUS) 14 MG TABS, Take 1 tablet by mouth daily. Take 30 minutes before breakfast (Patient not taking: Reported on 05/01/2019), Disp: 90 tablet, Rfl: 1   No Known Allergies   Review of Systems  Constitutional: Negative for fatigue.  Eyes: Negative for blurred vision.  Cardiovascular: Negative for chest pain.   Gastrointestinal: Positive for constipation. Negative for abdominal pain and flatus.  Endocrine: Negative for polydipsia, polyphagia and polyuria.  Neurological: Negative for dizziness and headaches.     Today's Vitals   05/01/19 1541  BP: 132/80  Pulse: 92  PainSc: 0-No pain   There is no height or weight on file to calculate BMI.   Objective:  Physical Exam Constitutional:      Appearance: Normal appearance. She is well-developed.  HENT:     Head: Normocephalic and atraumatic.     Right Ear: Hearing, tympanic membrane, ear canal and external ear normal.     Left Ear: Hearing, tympanic membrane, ear canal and external ear normal.     Nose: Nose normal.     Mouth/Throat:     Mouth: Mucous membranes are moist.  Eyes:     General: Lids are normal.     Conjunctiva/sclera: Conjunctivae normal.     Pupils: Pupils are equal, round, and reactive to light.     Funduscopic exam:    Right eye: No papilledema.        Left eye: No papilledema.  Neck:     Thyroid: No thyroid mass.     Vascular: No carotid bruit.  Cardiovascular:     Rate and Rhythm: Normal rate and regular rhythm.     Pulses: Normal pulses.     Heart sounds: Normal heart sounds. No murmur.  Pulmonary:     Effort: Pulmonary effort is normal.     Breath sounds: Normal breath sounds.  Abdominal:     General: Abdomen is flat. Bowel sounds are normal.     Palpations: Abdomen is soft.  Musculoskeletal:        General: No swelling. Normal range of motion.     Cervical back: Full passive range of motion without pain, normal range of motion and neck supple.     Right lower leg: No edema.     Left lower leg: No edema.  Skin:    General: Skin is warm and dry.     Capillary Refill: Capillary refill takes less than 2 seconds.  Neurological:     General: No focal deficit present.     Mental Status: She is alert and oriented to person, place, and time.     Cranial Nerves: No cranial nerve deficit.     Sensory: No  sensory deficit.  Psychiatric:        Mood and Affect: Mood normal.        Behavior: Behavior normal.        Thought Content: Thought content normal.        Judgment: Judgment normal.         Assessment And Plan:     1. Health maintenance examination . Behavior modifications discussed and diet history reviewed.   . Pt will continue to exercise regularly and modify diet with low GI, plant based foods and decrease intake of processed foods.  . Recommend intake of daily multivitamin, Vitamin D, and calcium.  . Recommend mammogram and colonoscopy for preventive screenings, as well as recommend immunizations that include influenza, TDAP, and Shingles -  Lipid panel - CMP14+EGFR - VITAMIN D 25 Hydroxy (Vit-D Deficiency, Fractures)  2. Encounter for immunization  Will give tetanus vaccine today while in office. Refer to order management. TDAP will be administered to adults 34-46 years old every 10 years.  She also received pneumonia vaccine and discussed risk of pneumonia with chronic health problems - Tdap (BOOSTRIX) injection 0.5 mL - Pneumococcal polysaccharide vaccine 23-valent greater than or equal to 2yo subcutaneous/IM  3. Type 2 diabetes mellitus without complication, without long-term current use of insulin ([place])  She feels has been better with her blood sugars  Will check HgbA1c  She is not taking rybelsus right now due to constipation.  - Lipid panel - CMP14+EGFR - Hemoglobin A1c - CBC   [name], FNP    THE PATIENT IS ENCOURAGED TO PRACTICE SOCIAL DISTANCING DUE TO THE COVID-19 PANDEMIC.

## 2019-05-01 NOTE — Patient Instructions (Addendum)
Health Maintenance, Female Adopting a healthy lifestyle and getting preventive care are important in promoting health and wellness. Ask your health care provider about:  The right schedule for you to have regular tests and exams.  Things you can do on your own to prevent diseases and keep yourself healthy. What should I know about diet, weight, and exercise? Eat a healthy diet   Eat a diet that includes plenty of vegetables, fruits, low-fat dairy products, and lean protein.  Do not eat a lot of foods that are high in solid fats, added sugars, or sodium. Maintain a healthy weight Body mass index (BMI) is used to identify weight problems. It estimates body fat based on height and weight. Your health care provider can help determine your BMI and help you achieve or maintain a healthy weight. Get regular exercise Get regular exercise. This is one of the most important things you can do for your health. Most adults should:  Exercise for at least 150 minutes each week. The exercise should increase your heart rate and make you sweat (moderate-intensity exercise).  Do strengthening exercises at least twice a week. This is in addition to the moderate-intensity exercise.  Spend less time sitting. Even light physical activity can be beneficial. Watch cholesterol and blood lipids Have your blood tested for lipids and cholesterol at 43 years of age, then have this test every 5 years. Have your cholesterol levels checked more often if:  Your lipid or cholesterol levels are high.  You are older than 43 years of age.  You are at high risk for heart disease. What should I know about cancer screening? Depending on your health history and family history, you may need to have cancer screening at various ages. This may include screening for:  Breast cancer.  Cervical cancer.  Colorectal cancer.  Skin cancer.  Lung cancer. What should I know about heart disease, diabetes, and high blood  pressure? Blood pressure and heart disease  High blood pressure causes heart disease and increases the risk of stroke. This is more likely to develop in people who have high blood pressure readings, are of African descent, or are overweight.  Have your blood pressure checked: ? Every 3-5 years if you are 18-39 years of age. ? Every year if you are 40 years old or older. Diabetes Have regular diabetes screenings. This checks your fasting blood sugar level. Have the screening done:  Once every three years after age 40 if you are at a normal weight and have a low risk for diabetes.  More often and at a younger age if you are overweight or have a high risk for diabetes. What should I know about preventing infection? Hepatitis B If you have a higher risk for hepatitis B, you should be screened for this virus. Talk with your health care provider to find out if you are at risk for hepatitis B infection. Hepatitis C Testing is recommended for:  Everyone born from 1945 through 1965.  Anyone with known risk factors for hepatitis C. Sexually transmitted infections (STIs)  Get screened for STIs, including gonorrhea and chlamydia, if: ? You are sexually active and are younger than 43 years of age. ? You are older than 43 years of age and your health care provider tells you that you are at risk for this type of infection. ? Your sexual activity has changed since you were last screened, and you are at increased risk for chlamydia or gonorrhea. Ask your health care provider if   you are at risk.  Ask your health care provider about whether you are at high risk for HIV. Your health care provider may recommend a prescription medicine to help prevent HIV infection. If you choose to take medicine to prevent HIV, you should first get tested for HIV. You should then be tested every 3 months for as long as you are taking the medicine. Pregnancy  If you are about to stop having your period (premenopausal) and  you may become pregnant, seek counseling before you get pregnant.  Take 400 to 800 micrograms (mcg) of folic acid every day if you become pregnant.  Ask for birth control (contraception) if you want to prevent pregnancy. Osteoporosis and menopause Osteoporosis is a disease in which the bones lose minerals and strength with aging. This can result in bone fractures. If you are 4 years old or older, or if you are at risk for osteoporosis and fractures, ask your health care provider if you should:  Be screened for bone loss.  Take a calcium or vitamin D supplement to lower your risk of fractures.  Be given hormone replacement therapy (HRT) to treat symptoms of menopause. Follow these instructions at home: Lifestyle  Do not use any products that contain nicotine or tobacco, such as cigarettes, e-cigarettes, and chewing tobacco. If you need help quitting, ask your health care provider.  Do not use street drugs.  Do not share needles.  Ask your health care provider for help if you need support or information about quitting drugs. Alcohol use  Do not drink alcohol if: ? Your health care provider tells you not to drink. ? You are pregnant, may be pregnant, or are planning to become pregnant.  If you drink alcohol: ? Limit how much you use to 0-1 drink a day. ? Limit intake if you are breastfeeding.  Be aware of how much alcohol is in your drink. In the [place] one drink equals one 12 oz bottle of beer (355 mL), one 5 oz glass of wine (148 mL), or one 1 oz glass of hard liquor (44 mL). General instructions  Schedule regular health, dental, and eye exams.  Stay current with your vaccines.  Tell your health care provider if: ? You often feel depressed. ? You have ever been abused or do not feel safe at home. Summary  Adopting a healthy lifestyle and getting preventive care are important in promoting health and wellness.  Follow your health care provider's instructions about healthy  diet, exercising, and getting tested or screened for diseases.  Follow your health care provider's instructions on monitoring your cholesterol and blood pressure. This information is not intended to replace advice given to you by your health care provider. Make sure you discuss any questions you have with your health care provider. Document Revised: 02/28/2018 Document Reviewed: 02/28/2018 Elsevier Patient Education  [employer].  Pneumococcal Conjugate Vaccine suspension for injection What is this medicine? PNEUMOCOCCAL VACCINE (NEU mo KOK al vak SEEN) is a vaccine used to prevent pneumococcus bacterial infections. These bacteria can cause serious infections like pneumonia, meningitis, and blood infections. This vaccine will lower your chance of getting pneumonia. If you do get pneumonia, it can make your symptoms milder and your illness shorter. This vaccine will not treat an infection and will not cause infection. This vaccine is recommended for infants and young children, adults with certain medical conditions, and adults 55 years or older. This medicine may be used for other purposes; ask your health care provider  or pharmacist if you have questions. COMMON BRAND NAME(S): Prevnar, Prevnar 13 What should I tell my health care provider before I take this medicine? They need to know if you have any of these conditions:  bleeding problems  fever  immune system problems  an unusual or allergic reaction to pneumococcal vaccine, diphtheria toxoid, other vaccines, latex, other medicines, foods, dyes, or preservatives  pregnant or trying to get pregnant  breast-feeding How should I use this medicine? This vaccine is for injection into a muscle. It is given by a health care professional. A copy of Vaccine Information Statements will be given before each vaccination. Read this sheet carefully each time. The sheet may change frequently. Talk to your pediatrician regarding the use of this  medicine in children. While this drug may be prescribed for children as young as 42 weeks old for selected conditions, precautions do apply. Overdosage: If you think you have taken too much of this medicine contact a poison control center or emergency room at once. NOTE: This medicine is only for you. Do not share this medicine with others. What if I miss a dose? It is important not to miss your dose. Call your doctor or health care professional if you are unable to keep an appointment. What may interact with this medicine?  medicines for cancer chemotherapy  medicines that suppress your immune function  steroid medicines like prednisone or cortisone This list may not describe all possible interactions. Give your health care provider a list of all the medicines, herbs, non-prescription drugs, or dietary supplements you use. Also tell them if you smoke, drink alcohol, or use illegal drugs. Some items may interact with your medicine. What should I watch for while using this medicine? Mild fever and pain should go away in 3 days or less. Report any unusual symptoms to your doctor or health care professional. What side effects may I notice from receiving this medicine? Side effects that you should report to your doctor or health care professional as soon as possible:  allergic reactions like skin rash, itching or hives, swelling of the face, lips, or tongue  breathing problems  confused  fast or irregular heartbeat  fever over 102 degrees F  seizures  unusual bleeding or bruising  unusual muscle weakness Side effects that usually do not require medical attention (report to your doctor or health care professional if they continue or are bothersome):  aches and pains  diarrhea  fever of 102 degrees F or less  headache  irritable  loss of appetite  pain, tender at site where injected  trouble sleeping This list may not describe all possible side effects. Call your doctor for  medical advice about side effects. You may report side effects to FDA at 1-800-FDA-1088. Where should I keep my medicine? This does not apply. This vaccine is given in a clinic, pharmacy, doctor's office, or other health care setting and will not be stored at home. NOTE: This sheet is a summary. It may not cover all possible information. If you have questions about this medicine, talk to your doctor, pharmacist, or health care provider.  2020 Elsevier/Gold Standard (2013-12-12 10:27:27) Influenza Virus Vaccine (Flucelvax) What is this medicine? INFLUENZA VIRUS VACCINE (in floo EN zuh VAHY ruhs vak SEEN) helps to reduce the risk of getting influenza also known as the flu. The vaccine only helps protect you against some strains of the flu. This medicine may be used for other purposes; ask your health care provider or pharmacist if  you have questions. COMMON BRAND NAME(S): FLUCELVAX What should I tell my health care provider before I take this medicine? They need to know if you have any of these conditions:  bleeding disorder like hemophilia  fever or infection  Guillain-Barre syndrome or other neurological problems  immune system problems  infection with the human immunodeficiency virus (HIV) or AIDS  low blood platelet counts  multiple sclerosis  an unusual or allergic reaction to influenza virus vaccine, other medicines, foods, dyes or preservatives  pregnant or trying to get pregnant  breast-feeding How should I use this medicine? This vaccine is for injection into a muscle. It is given by a health care professional. A copy of Vaccine Information Statements will be given before each vaccination. Read this sheet carefully each time. The sheet may change frequently. Talk to your pediatrician regarding the use of this medicine in children. Special care may be needed. Overdosage: If you think you've taken too much of this medicine contact a poison control center or emergency room at  once. Overdosage: If you think you have taken too much of this medicine contact a poison control center or emergency room at once. NOTE: This medicine is only for you. Do not share this medicine with others. What if I miss a dose? This does not apply. What may interact with this medicine?  chemotherapy or radiation therapy  medicines that lower your immune system like etanercept, anakinra, infliximab, and adalimumab  medicines that treat or prevent blood clots like warfarin  phenytoin  steroid medicines like prednisone or cortisone  theophylline  vaccines This list may not describe all possible interactions. Give your health care provider a list of all the medicines, herbs, non-prescription drugs, or dietary supplements you use. Also tell them if you smoke, drink alcohol, or use illegal drugs. Some items may interact with your medicine. What should I watch for while using this medicine? Report any side effects that do not go away within 3 days to your doctor or health care professional. Call your health care provider if any unusual symptoms occur within 6 weeks of receiving this vaccine. You may still catch the flu, but the illness is not usually as bad. You cannot get the flu from the vaccine. The vaccine will not protect against colds or other illnesses that may cause fever. The vaccine is needed every year. What side effects may I notice from receiving this medicine? Side effects that you should report to your doctor or health care professional as soon as possible:  allergic reactions like skin rash, itching or hives, swelling of the face, lips, or tongue Side effects that usually do not require medical attention (Report these to your doctor or health care professional if they continue or are bothersome.):  fever  headache  muscle aches and pains  pain, tenderness, redness, or swelling at the injection site  tiredness This list may not describe all possible side effects.  Call your doctor for medical advice about side effects. You may report side effects to FDA at 1-800-FDA-1088. Where should I keep my medicine? The vaccine will be given by a health care professional in a clinic, pharmacy, doctor's office, or other health care setting. You will not be given vaccine doses to store at home. NOTE: This sheet is a summary. It may not cover all possible information. If you have questions about this medicine, talk to your doctor, pharmacist, or health care provider.  2020 Elsevier/Gold Standard (2011-02-16 14:06:47)

## 2019-05-02 LAB — CMP14+EGFR
ALT: 12 IU/L (ref 0–32)
AST: 12 IU/L (ref 0–40)
Albumin/Globulin Ratio: 1.5 (ref 1.2–2.2)
Albumin: 4.5 g/dL (ref 3.8–4.8)
Alkaline Phosphatase: 94 IU/L (ref 39–117)
BUN/Creatinine Ratio: 18 (ref 9–23)
BUN: 11 mg/dL (ref 6–24)
Bilirubin Total: <0.2 mg/dL (ref 0.0–1.2)
CO2: 21 mmol/L (ref 20–29)
Calcium: 9.8 mg/dL (ref 8.7–10.2)
Chloride: 102 mmol/L (ref 96–106)
Creatinine, Ser: 0.62 mg/dL (ref 0.57–1.00)
GFR calc Af Amer: 129 mL/min/{1.73_m2} (ref >59)
GFR calc non Af Amer: 112 mL/min/{1.73_m2} (ref >59)
Globulin, Total: 3.1 g/dL (ref 1.5–4.5)
Glucose: 171 mg/dL — ABNORMAL HIGH (ref 65–99)
Potassium: 4.3 mmol/L (ref 3.5–5.2)
Sodium: 140 mmol/L (ref 134–144)
Total Protein: 7.6 g/dL (ref 6.0–8.5)

## 2019-05-02 LAB — HEMOGLOBIN A1C
Est. average glucose Bld gHb Est-mCnc: 174 mg/dL
Hgb A1c MFr Bld: 7.7 % — ABNORMAL HIGH (ref 4.8–5.6)

## 2019-05-02 LAB — VITAMIN D 25 HYDROXY (VIT D DEFICIENCY, FRACTURES): Vit D, 25-Hydroxy: 16.7 ng/mL — ABNORMAL LOW (ref 30.0–100.0)

## 2019-05-02 LAB — CBC
Hematocrit: 39.5 % (ref 34.0–46.6)
Hemoglobin: 12.1 g/dL (ref 11.1–15.9)
MCH: 22.2 pg — ABNORMAL LOW (ref 26.6–33.0)
MCHC: 30.6 g/dL — ABNORMAL LOW (ref 31.5–35.7)
MCV: 73 fL — ABNORMAL LOW (ref 79–97)
Platelets: 351 10*3/uL (ref 150–450)
RBC: 5.44 x10E6/uL — ABNORMAL HIGH (ref 3.77–5.28)
RDW: 13.8 % (ref 11.7–15.4)
WBC: 7.6 10*3/uL (ref 3.4–10.8)

## 2019-05-02 LAB — LIPID PANEL
Chol/HDL Ratio: 4.7 ratio — ABNORMAL HIGH (ref 0.0–4.4)
Cholesterol, Total: 256 mg/dL — ABNORMAL HIGH (ref 100–199)
HDL: 54 mg/dL (ref >39)
LDL Chol Calc (NIH): 173 mg/dL — ABNORMAL HIGH (ref 0–99)
Triglycerides: 160 mg/dL — ABNORMAL HIGH (ref 0–149)
VLDL Cholesterol Cal: 29 mg/dL (ref 5–40)

## 2019-05-09 ENCOUNTER — Other Ambulatory Visit: Payer: Self-pay | Admitting: Nurse Practitioner

## 2019-05-09 DIAGNOSIS — E119 Type 2 diabetes mellitus without complications: Secondary | ICD-10-CM

## 2019-05-09 DIAGNOSIS — E782 Mixed hyperlipidemia: Secondary | ICD-10-CM

## 2019-05-09 MED ORDER — ATORVASTATIN CALCIUM 10 MG PO TABS: 10.0000 mg | ORAL_TABLET | Freq: Every day | ORAL | 3 refills | Status: DC

## 2019-06-13 ENCOUNTER — Other Ambulatory Visit: Payer: Self-pay

## 2019-06-13 DIAGNOSIS — E119 Type 2 diabetes mellitus without complications: Secondary | ICD-10-CM

## 2019-06-13 MED ORDER — TRESIBA FLEXTOUCH 100 UNIT/ML ~~LOC~~ SOPN: 20.0000 [IU] | PEN_INJECTOR | Freq: Every day | SUBCUTANEOUS | 3 refills | Status: DC

## 2019-06-13 MED ORDER — JARDIANCE 10 MG PO TABS: 10.0000 mg | ORAL_TABLET | Freq: Every day | ORAL | 0 refills | Status: DC

## 2019-07-29 ENCOUNTER — Encounter: Payer: Self-pay | Admitting: Nurse Practitioner

## 2019-07-29 ENCOUNTER — Ambulatory Visit: Payer: BC Managed Care – PPO | Admitting: Nurse Practitioner

## 2019-07-29 ENCOUNTER — Other Ambulatory Visit: Payer: Self-pay

## 2019-07-29 VITALS — BP 130/90 | HR 93 | Temp 98.2°F | Ht 63.6 in | Wt 225.8 lb

## 2019-07-29 DIAGNOSIS — Z794 Long term (current) use of insulin: Secondary | ICD-10-CM | POA: Diagnosis not present

## 2019-07-29 DIAGNOSIS — E119 Type 2 diabetes mellitus without complications: Secondary | ICD-10-CM | POA: Diagnosis not present

## 2019-07-29 DIAGNOSIS — E782 Mixed hyperlipidemia: Secondary | ICD-10-CM | POA: Diagnosis not present

## 2019-07-29 MED ORDER — LOSARTAN POTASSIUM 25 MG PO TABS: 25.0000 mg | ORAL_TABLET | Freq: Every day | ORAL | 0 refills | Status: DC

## 2019-07-29 NOTE — Progress Notes (Signed)
This visit occurred during the SARS-CoV-2 public health emergency.  Safety protocols were in place, including screening questions prior to the visit, additional usage of staff PPE, and extensive cleaning of exam room while observing appropriate contact time as indicated for disinfecting solutions.  Subjective:     Patient ID: Diane Chaney , female    DOB: 07/07/1975 , 44 y.o.   MRN: [id number]   Chief Complaint  Patient presents with  . Diabetes    HPI     Wt Readings from Last 3 Encounters: 07/29/19 : 225 lb 12.8 oz (102.4 kg) 05/01/19 : 214 lb (97.1 kg) 01/29/19 : 215 lb 9.6 oz (97.8 kg)  She had been on Rybelsus daily but was having constipation.     Diabetes She presents for her follow-up diabetic visit. She has type 2 diabetes mellitus. Her disease course has been improving. There are no hypoglycemic associated symptoms. Pertinent negatives for hypoglycemia include no dizziness or headaches. Pertinent negatives for diabetes include no blurred vision, no chest pain, no fatigue, no polydipsia, no polyphagia and no polyuria. There are no hypoglycemic complications. There are no diabetic complications. Risk factors for coronary artery disease include diabetes mellitus, obesity and sedentary lifestyle. Current diabetic treatment includes oral agent (dual therapy). She is compliant with treatment most of the time. She is following a generally unhealthy diet. She has not had a previous visit with a dietitian. She rarely participates in exercise. (Blood sugar am 135.  Taking Ozempic for about 2 weeks without any nausea.  ) She does not see a podiatrist.Eye exam is current (03/30/2019 with Dr Katy Fitch).  Constipation This is a new problem. The current episode started more than 1 month ago. The problem has been rapidly worsening since onset. The patient is not on a high fiber diet. She does not exercise regularly. There has not been adequate water intake. Pertinent negatives include no  abdominal pain or flatus. Risk factors include obesity. She has tried enemas, laxatives and stool softeners (today used an enema) for the symptoms. The treatment provided moderate relief. There is no history of abdominal surgery.     History reviewed. No pertinent family history.   Current Outpatient Medications:  .  acetaminophen (TYLENOL) 500 MG tablet, Take 1,000 mg by mouth every 6 (six) hours as needed for moderate pain., Disp: , Rfl:  .  atorvastatin (LIPITOR) 10 MG tablet, Take 1 tablet (10 mg total) by mouth daily., Disp: 30 tablet, Rfl: 3 .  blood glucose meter kit and supplies KIT, Dispense based on patient and insurance preference. Use up to four times daily as directed. (FOR ICD-9 250.00, 250.01)., Disp: 1 each, Rfl: 0 .  insulin degludec (TRESIBA FLEXTOUCH) 100 UNIT/ML FlexTouch Pen, Inject 0.2 mLs (20 Units total) into the skin daily., Disp: 5 pen, Rfl: 3 .  Insulin Pen Needle (PEN NEEDLES) 32G X 6 MM MISC, 1 each by Does not apply route daily., Disp: 100 each, Rfl: 3 .  Multiple Vitamin (MULTIVITAMIN WITH MINERALS) TABS tablet, Take 1 tablet by mouth daily., Disp: , Rfl:  .  Semaglutide,0.25 or 0.5MG/DOS, (OZEMPIC, 0.25 OR 0.5 MG/DOSE,) 2 MG/1.5ML SOPN, Inject 0.25 mLs into the skin once a week., Disp: , Rfl:  .  losartan (COZAAR) 25 MG tablet, Take 1 tablet (25 mg total) by mouth daily., Disp: 90 tablet, Rfl: 0   No Known Allergies   Review of Systems  Constitutional: Negative for fatigue.  Eyes: Negative for blurred vision.  Respiratory: Negative for cough.  Cardiovascular: Negative for chest pain.  Gastrointestinal: Negative for abdominal pain, constipation and flatus.  Endocrine: Negative for polydipsia, polyphagia and polyuria.  Neurological: Negative for dizziness and headaches.  Psychiatric/Behavioral: Negative.      Today's Vitals   07/29/19 1614  BP: 130/90  Pulse: 93  Temp: 98.2 F (36.8 C)  TempSrc: Oral  Weight: 225 lb 12.8 oz (102.4 kg)  Height: 5'  3.6" (1.615 m)  PainSc: 0-No pain   Body mass index is 39.25 kg/m.   Objective:  Physical Exam Constitutional:      Appearance: Normal appearance. She is well-developed.  HENT:     Head: Normocephalic and atraumatic.     Right Ear: Hearing, tympanic membrane, ear canal and external ear normal.     Left Ear: Hearing, tympanic membrane, ear canal and external ear normal.     Nose: Nose normal.     Mouth/Throat:     Mouth: Mucous membranes are moist.  Eyes:     General: Lids are normal.     Conjunctiva/sclera: Conjunctivae normal.     Pupils: Pupils are equal, round, and reactive to light.     Funduscopic exam:    Right eye: No papilledema.        Left eye: No papilledema.  Neck:     Thyroid: No thyroid mass.     Vascular: No carotid bruit.  Cardiovascular:     Rate and Rhythm: Normal rate and regular rhythm.     Pulses: Normal pulses.     Heart sounds: Normal heart sounds. No murmur.  Pulmonary:     Effort: Pulmonary effort is normal.     Breath sounds: Normal breath sounds.  Abdominal:     General: Abdomen is flat. Bowel sounds are normal.     Palpations: Abdomen is soft.  Musculoskeletal:        General: No swelling. Normal range of motion.     Cervical back: Full passive range of motion without pain, normal range of motion and neck supple.     Right lower leg: No edema.     Left lower leg: No edema.  Skin:    General: Skin is warm and dry.     Capillary Refill: Capillary refill takes less than 2 seconds.  Neurological:     General: No focal deficit present.     Mental Status: She is alert and oriented to person, place, and time.     Cranial Nerves: No cranial nerve deficit.     Sensory: No sensory deficit.  Psychiatric:        Mood and Affect: Mood normal.        Behavior: Behavior normal.        Thought Content: Thought content normal.        Judgment: Judgment normal.         Assessment And Plan:     1. Type 2 diabetes mellitus without complication,  with long-term current use of insulin (HCC)  Chronic, controlled  Continue with current medications  I strongly encouraged to limit intake of sugary foods and drinks  Encouraged to increase physical activity to 150 minutes per week even if starting with 10-15 minutes at a time  Will start her on low dose losartan for kidney protection  Educated on the effects of diabetes - Hemoglobin A1c - CMP14+EGFR - losartan (COZAAR) 25 MG tablet; Take 1 tablet (25 mg total) by mouth daily.  Dispense: 90 tablet; Refill: 0  2. Mixed hyperlipidemia  Mainly for  cardiac protection since has diabetes - Lipid panel   [name], FNP    THE PATIENT IS ENCOURAGED TO PRACTICE SOCIAL DISTANCING DUE TO THE COVID-19 PANDEMIC.

## 2019-07-29 NOTE — Patient Instructions (Signed)
Type 2 Diabetes Mellitus, Diagnosis, Adult Type 2 diabetes (type 2 diabetes mellitus) is a long-term (chronic) disease. It may be caused by one or both of these problems:  Your pancreas does not make enough of a hormone called insulin.  Your body does not react in a normal way to insulin that it makes. Insulin lets sugars (glucose) go into cells in your body. This gives you energy. If you have type 2 diabetes, sugars cannot get into cells. This causes high blood sugar (hyperglycemia). Your doctor will set treatment goals for you. Generally, you should have these blood sugar levels:  Before meals (preprandial): 80-130 mg/dL (4.4-7.2 mmol/L).  After meals (postprandial): below 180 mg/dL (10 mmol/L).  A1c (hemoglobin A1c) level: less than 7%. Follow these instructions at home: Questions to ask your doctor  You may want to ask these questions: ? Do I need to meet with a diabetes educator? ? Where can I find a support group for people with diabetes? ? What equipment will I need to care for myself at home? ? What diabetes medicines do I need? When should I take them? ? How often do I need to check my blood sugar? ? What number can I call if I have questions? ? When is my next doctor's visit? General instructions  Take over-the-counter and prescription medicines only as told by your doctor.  Keep all follow-up visits as told by your doctor. This is important. Contact a doctor if:  Your blood sugar is at or above 240 mg/dL (13.3 mmol/L) for 2 days in a row.  You have been sick for 2 days or more, and you are not getting better.  You have had a fever for 2 days or more, and you are not getting better.  You have any of these problems for more than 6 hours: ? You cannot eat or drink. ? You feel sick to your stomach (nauseous). ? You throw up (vomit). ? You have watery poop (diarrhea). Get help right away if:  Your blood sugar is lower than 54 mg/dL (3 mmol/L).  You get  confused.  You have trouble: ? Thinking clearly. ? Breathing.  You have moderate or large ketone levels in your pee (urine). Summary  Type 2 diabetes is a long-term (chronic) disease. Your pancreas may not make enough of a hormone called insulin, or your body may not react normally to insulin that it makes.  Take over-the-counter and prescription medicines only as told by your doctor.  Keep all follow-up visits as told by your doctor. This is important. This information is not intended to replace advice given to you by your health care provider. Make sure you discuss any questions you have with your health care provider. Document Revised: 05/05/2017 Document Reviewed: 04/10/2015 Elsevier Patient Education  2020 [employer].

## 2019-07-30 LAB — CMP14+EGFR
ALT: 13 IU/L (ref 0–32)
AST: 9 IU/L (ref 0–40)
Albumin/Globulin Ratio: 1.1 — ABNORMAL LOW (ref 1.2–2.2)
Albumin: 4 g/dL (ref 3.8–4.8)
Alkaline Phosphatase: 102 IU/L (ref 39–117)
BUN/Creatinine Ratio: 17 (ref 9–23)
BUN: 11 mg/dL (ref 6–24)
Bilirubin Total: <0.2 mg/dL (ref 0.0–1.2)
CO2: 23 mmol/L (ref 20–29)
Calcium: 9.4 mg/dL (ref 8.7–10.2)
Chloride: 101 mmol/L (ref 96–106)
Creatinine, Ser: 0.63 mg/dL (ref 0.57–1.00)
GFR calc Af Amer: 128 mL/min/{1.73_m2} (ref >59)
GFR calc non Af Amer: 111 mL/min/{1.73_m2} (ref >59)
Globulin, Total: 3.6 g/dL (ref 1.5–4.5)
Glucose: 202 mg/dL — ABNORMAL HIGH (ref 65–99)
Potassium: 4.2 mmol/L (ref 3.5–5.2)
Sodium: 138 mmol/L (ref 134–144)
Total Protein: 7.6 g/dL (ref 6.0–8.5)

## 2019-07-30 LAB — LIPID PANEL
Chol/HDL Ratio: 4.1 ratio (ref 0.0–4.4)
Cholesterol, Total: 219 mg/dL — ABNORMAL HIGH (ref 100–199)
HDL: 53 mg/dL (ref >39)
LDL Chol Calc (NIH): 144 mg/dL — ABNORMAL HIGH (ref 0–99)
Triglycerides: 125 mg/dL (ref 0–149)
VLDL Cholesterol Cal: 22 mg/dL (ref 5–40)

## 2019-07-30 LAB — HEMOGLOBIN A1C
Est. average glucose Bld gHb Est-mCnc: 275 mg/dL
Hgb A1c MFr Bld: 11.2 % — ABNORMAL HIGH (ref 4.8–5.6)

## 2019-08-06 ENCOUNTER — Other Ambulatory Visit: Payer: Self-pay | Admitting: Nurse Practitioner

## 2019-08-06 DIAGNOSIS — E782 Mixed hyperlipidemia: Secondary | ICD-10-CM

## 2019-09-10 ENCOUNTER — Other Ambulatory Visit: Payer: Self-pay

## 2019-09-10 MED ORDER — OZEMPIC (0.25 OR 0.5 MG/DOSE) 2 MG/1.5ML ~~LOC~~ SOPN: PEN_INJECTOR | SUBCUTANEOUS | 1 refills | Status: DC

## 2019-11-05 ENCOUNTER — Encounter: Payer: Self-pay | Admitting: Nurse Practitioner

## 2019-11-05 ENCOUNTER — Other Ambulatory Visit: Payer: Self-pay

## 2019-11-05 ENCOUNTER — Ambulatory Visit: Payer: BC Managed Care – PPO | Admitting: Nurse Practitioner

## 2019-11-05 VITALS — BP 136/80 | HR 96 | Temp 98.5°F | Ht 63.2 in | Wt 228.8 lb

## 2019-11-05 DIAGNOSIS — E119 Type 2 diabetes mellitus without complications: Secondary | ICD-10-CM

## 2019-11-05 DIAGNOSIS — E782 Mixed hyperlipidemia: Secondary | ICD-10-CM | POA: Diagnosis not present

## 2019-11-05 DIAGNOSIS — Z794 Long term (current) use of insulin: Secondary | ICD-10-CM

## 2019-11-05 DIAGNOSIS — Z1159 Encounter for screening for other viral diseases: Secondary | ICD-10-CM | POA: Diagnosis not present

## 2019-11-05 DIAGNOSIS — Z6841 Body Mass Index (BMI) 40.0 and over, adult: Secondary | ICD-10-CM

## 2019-11-05 MED ORDER — TRESIBA FLEXTOUCH 100 UNIT/ML ~~LOC~~ SOPN: 20.0000 [IU] | PEN_INJECTOR | Freq: Every day | SUBCUTANEOUS | 3 refills | Status: DC

## 2019-11-05 MED ORDER — PEN NEEDLES 32G X 6 MM MISC: 1.0000 | Freq: Every day | 3 refills | Status: DC

## 2019-11-05 MED ORDER — OZEMPIC (1 MG/DOSE) 2 MG/1.5ML ~~LOC~~ SOPN: 1.0000 mg | PEN_INJECTOR | SUBCUTANEOUS | 1 refills | Status: DC

## 2019-11-05 NOTE — Progress Notes (Signed)
This visit occurred during the SARS-CoV-2 public health emergency.  Safety protocols were in place, including screening questions prior to the visit, additional usage of staff PPE, and extensive cleaning of exam room while observing appropriate contact time as indicated for disinfecting solutions.  Subjective:     Patient ID: Diane Chaney , female    DOB: Dec 26, 1975 , 44 y.o.   MRN: [id number]   Chief Complaint  Patient presents with  . Diabetes    HPI  She is working from home.    She does not feel the Ozempic is helping curb her appetite. She was in [place] after losing her mother.  She had forgot her medication for about one week.  When she came back she had difficulty with eating.    Wt Readings from Last 3 Encounters: 11/05/19 : 228 lb 12.8 oz (103.8 kg) 07/29/19 : 225 lb 12.8 oz (102.4 kg) 05/01/19 : 214 lb (97.1 kg)  She tells me she will "binge eat sweets".  Even with the medication she has not been able to cut her binging. Has been better at times but sometimes not.   Diabetes She presents for her follow-up diabetic visit. She has type 2 diabetes mellitus. Her disease course has been improving. There are no hypoglycemic associated symptoms. Pertinent negatives for hypoglycemia include no dizziness or headaches. Pertinent negatives for diabetes include no blurred vision, no chest pain, no fatigue, no polydipsia, no polyphagia and no polyuria. Current diabetic treatment includes oral agent (monotherapy). She is compliant with treatment most of the time. Insulin injections are given by patient. She is following a generally healthy diet. She has not had a previous visit with a dietitian. She rarely participates in exercise. There is no change in her home blood glucose trend. (Fasting in the morning have been 130-160, she is not checking regularly.  ) She does not see a podiatrist.Eye exam is not current.     Past Medical History:  Diagnosis Date  . Hyperglycemia       History reviewed. No pertinent family history.   Current Outpatient Medications:  .  acetaminophen (TYLENOL) 500 MG tablet, Take 1,000 mg by mouth every 6 (six) hours as needed for moderate pain., Disp: , Rfl:  .  atorvastatin (LIPITOR) 10 MG tablet, TAKE 1 TABLET BY MOUTH EVERY DAY, Disp: 90 tablet, Rfl: 1 .  blood glucose meter kit and supplies KIT, Dispense based on patient and insurance preference. Use up to four times daily as directed. (FOR ICD-9 250.00, 250.01)., Disp: 1 each, Rfl: 0 .  insulin degludec (TRESIBA FLEXTOUCH) 100 UNIT/ML FlexTouch Pen, Inject 0.2 mLs (20 Units total) into the skin daily., Disp: 5 mL, Rfl: 3 .  Insulin Pen Needle (PEN NEEDLES) 32G X 6 MM MISC, 1 each by Does not apply route daily., Disp: 100 each, Rfl: 3 .  losartan (COZAAR) 25 MG tablet, Take 1 tablet (25 mg total) by mouth daily., Disp: 90 tablet, Rfl: 0 .  Multiple Vitamin (MULTIVITAMIN WITH MINERALS) TABS tablet, Take 1 tablet by mouth daily., Disp: , Rfl:  .  Semaglutide, 1 MG/DOSE, (OZEMPIC, 1 MG/DOSE,) 2 MG/1.5ML SOPN, Inject 0.75 mLs (1 mg total) into the skin once a week., Disp: 9 mL, Rfl: 1   No Known Allergies   Review of Systems  Constitutional: Negative for chills and fatigue.  Eyes: Negative for blurred vision.  Respiratory: Negative.   Cardiovascular: Negative for chest pain, palpitations and leg swelling.  Endocrine: Negative for polydipsia, polyphagia and polyuria.  Neurological: Negative for dizziness and headaches.  Psychiatric/Behavioral: Negative.      Today's Vitals   11/05/19 1612  BP: 136/80  Pulse: 96  Temp: 98.5 F (36.9 C)  TempSrc: Oral  Weight: 228 lb 12.8 oz (103.8 kg)  Height: 5' 3.2" (1.605 m)  PainSc: 0-No pain   Body mass index is 40.27 kg/m.   Objective:  Physical Exam Constitutional:      General: She is not in acute distress.    Appearance: Normal appearance. She is well-developed. She is obese.  Cardiovascular:     Rate and Rhythm: Normal rate  and regular rhythm.     Pulses: Normal pulses.     Heart sounds: Normal heart sounds. No murmur heard.   Pulmonary:     Effort: Pulmonary effort is normal. No respiratory distress.     Breath sounds: Normal breath sounds.  Chest:     Chest wall: No tenderness.  Skin:    Capillary Refill: Capillary refill takes less than 2 seconds.  Neurological:     General: No focal deficit present.     Mental Status: She is alert and oriented to person, place, and time.     Cranial Nerves: No cranial nerve deficit.  Psychiatric:        Mood and Affect: Mood normal.        Behavior: Behavior normal.        Thought Content: Thought content normal.        Judgment: Judgment normal.         Assessment And Plan:     1. Type 2 diabetes mellitus without complication, with long-term current use of insulin (HCC)  Chronic, she does admit to not eating the most healthy diet and regular exercise.  - Semaglutide, 1 MG/DOSE, (OZEMPIC, 1 MG/DOSE,) 2 MG/1.5ML SOPN; Inject 0.75 mLs (1 mg total) into the skin once a week.  Dispense: 9 mL; Refill: 1 - insulin degludec (TRESIBA FLEXTOUCH) 100 UNIT/ML FlexTouch Pen; Inject 0.2 mLs (20 Units total) into the skin daily.  Dispense: 5 mL; Refill: 3 - CMP14+EGFR - Hemoglobin A1c - Lipid panel  2. Mixed hyperlipidemia  Chronic, stable  She is encouraged to take her statin regularly - Lipid panel  3. Class 3 severe obesity due to excess calories without serious comorbidity with body mass index (BMI) of 40.0 to 44.9 in adult [facility])  Chronic  Discussed healthy diet and regular exercise options   Encouraged to exercise at least 150 minutes per week with 2 days of strength training  I am referring her to weight management to help with her diet and possibly to help with behavior changes and mental understanding - Amb Ref to Medical Weight Management  4. Encounter for hepatitis C screening test for low risk patient  Will check Hepatitis C screening due to  recent recommendations to screen all adults 18 years and older - Hepatitis C antibody        Patient was given opportunity to ask questions. Patient verbalized understanding of the plan and was able to repeat key elements of the plan. All questions were answered to their satisfaction.  [name], FNP   I, [name], FNP, have reviewed all documentation for this visit. The documentation on 11/17/19 for the exam, diagnosis, procedures, and orders are all accurate and complete.   THE PATIENT IS ENCOURAGED TO PRACTICE SOCIAL DISTANCING DUE TO THE COVID-19 PANDEMIC.

## 2019-11-06 LAB — LIPID PANEL
Chol/HDL Ratio: 3.8 ratio (ref 0.0–4.4)
Cholesterol, Total: 200 mg/dL — ABNORMAL HIGH (ref 100–199)
HDL: 53 mg/dL (ref >39)
LDL Chol Calc (NIH): 128 mg/dL — ABNORMAL HIGH (ref 0–99)
Triglycerides: 103 mg/dL (ref 0–149)
VLDL Cholesterol Cal: 19 mg/dL (ref 5–40)

## 2019-11-06 LAB — CMP14+EGFR
ALT: 14 IU/L (ref 0–32)
AST: 16 IU/L (ref 0–40)
Albumin/Globulin Ratio: 1.4 (ref 1.2–2.2)
Albumin: 4.3 g/dL (ref 3.8–4.8)
Alkaline Phosphatase: 100 IU/L (ref 48–121)
BUN/Creatinine Ratio: 17 (ref 9–23)
BUN: 10 mg/dL (ref 6–24)
Bilirubin Total: <0.2 mg/dL (ref 0.0–1.2)
CO2: 23 mmol/L (ref 20–29)
Calcium: 9.6 mg/dL (ref 8.7–10.2)
Chloride: 103 mmol/L (ref 96–106)
Creatinine, Ser: 0.6 mg/dL (ref 0.57–1.00)
GFR calc Af Amer: 129 mL/min/{1.73_m2} (ref >59)
GFR calc non Af Amer: 112 mL/min/{1.73_m2} (ref >59)
Globulin, Total: 3.1 g/dL (ref 1.5–4.5)
Glucose: 131 mg/dL — ABNORMAL HIGH (ref 65–99)
Potassium: 4.2 mmol/L (ref 3.5–5.2)
Sodium: 140 mmol/L (ref 134–144)
Total Protein: 7.4 g/dL (ref 6.0–8.5)

## 2019-11-06 LAB — HEMOGLOBIN A1C
Est. average glucose Bld gHb Est-mCnc: 232 mg/dL
Hgb A1c MFr Bld: 9.7 % — ABNORMAL HIGH (ref 4.8–5.6)

## 2019-11-06 LAB — HEPATITIS C ANTIBODY: Hep C Virus Ab: <0.1 s/co ratio (ref 0.0–0.9)

## 2020-02-05 ENCOUNTER — Ambulatory Visit: Payer: BC Managed Care – PPO | Admitting: Nurse Practitioner

## 2020-02-11 ENCOUNTER — Encounter: Payer: Self-pay | Admitting: Nurse Practitioner

## 2020-02-11 ENCOUNTER — Ambulatory Visit (INDEPENDENT_AMBULATORY_CARE_PROVIDER_SITE_OTHER): Payer: BC Managed Care – PPO | Admitting: Nurse Practitioner

## 2020-02-11 ENCOUNTER — Other Ambulatory Visit: Payer: Self-pay

## 2020-02-11 VITALS — BP 132/84 | HR 98 | Temp 97.9°F | Ht 63.2 in | Wt 225.2 lb

## 2020-02-11 DIAGNOSIS — E782 Mixed hyperlipidemia: Secondary | ICD-10-CM

## 2020-02-11 DIAGNOSIS — E119 Type 2 diabetes mellitus without complications: Secondary | ICD-10-CM

## 2020-02-11 DIAGNOSIS — Z794 Long term (current) use of insulin: Secondary | ICD-10-CM | POA: Diagnosis not present

## 2020-02-11 DIAGNOSIS — Z114 Encounter for screening for human immunodeficiency virus [HIV]: Secondary | ICD-10-CM | POA: Diagnosis not present

## 2020-02-11 DIAGNOSIS — IMO0002 Reserved for concepts with insufficient information to code with codable children: Secondary | ICD-10-CM

## 2020-02-11 DIAGNOSIS — E1165 Type 2 diabetes mellitus with hyperglycemia: Secondary | ICD-10-CM | POA: Diagnosis not present

## 2020-02-11 MED ORDER — LOSARTAN POTASSIUM 25 MG PO TABS: 25.0000 mg | ORAL_TABLET | Freq: Every day | ORAL | 0 refills | Status: DC

## 2020-02-11 MED ORDER — RYBELSUS 14 MG PO TABS: ORAL_TABLET | ORAL | 1 refills | Status: DC

## 2020-02-11 MED ORDER — ATORVASTATIN CALCIUM 10 MG PO TABS: 10.0000 mg | ORAL_TABLET | Freq: Every day | ORAL | 1 refills | Status: DC

## 2020-02-11 NOTE — Progress Notes (Signed)
I,Yamilka Roman [employer] as a Education administrator for [employer], FNP.,have documented all relevant documentation on the behalf of [name], FNP,as directed by  [name], FNP while in the presence of [name], [place]. This visit occurred during the SARS-CoV-2 public health emergency.  Safety protocols were in place, including screening questions prior to the visit, additional usage of staff PPE, and extensive cleaning of exam room while observing appropriate contact time as indicated for disinfecting solutions.  Subjective:     Patient ID: Diane Chaney , female    DOB: [birth year] , 44 y.o.   MRN: [id number]   Chief Complaint  Patient presents with  . Diabetes    HPI  Patient here for a f/u on her diabetes.   Wt Readings from Last 3 Encounters: 02/11/20 : 225 lb 3.2 oz (102.2 kg) 11/05/19 : 228 lb 12.8 oz (103.8 kg) 07/29/19 : 225 lb 12.8 oz (102.4 kg)  She does report she is no longer taking the Ozempic due to continuing to be hungry so on her own she went back to rybelsus because she was less hungry. She has not had constipation this time with Rybelsus. She does not feel as hungry.   Rybelsus 14 mg taking for about 6 weeks.  She is going to [employer] and is on a monthly plan.    Diabetes She presents for her follow-up diabetic visit. She has type 2 diabetes mellitus. There are no hypoglycemic associated symptoms. Pertinent negatives for hypoglycemia include no confusion, dizziness or headaches. There are no diabetic associated symptoms. Pertinent negatives for diabetes include no chest pain. Symptoms are worsening. Current diabetic treatment includes oral agent (dual therapy). (Blood sugar averaging 140.  She has been out of her Tyler Aas for about one week due to low supply at the pharmacy.  )     Past Medical History:  Diagnosis Date  . Hyperglycemia      History reviewed. No pertinent family history.   Current Outpatient Medications:  .  acetaminophen (TYLENOL) 500 MG  tablet, Take 1,000 mg by mouth every 6 (six) hours as needed for moderate pain., Disp: , Rfl:  .  atorvastatin (LIPITOR) 10 MG tablet, Take 1 tablet (10 mg total) by mouth daily., Disp: 90 tablet, Rfl: 1 .  blood glucose meter kit and supplies KIT, Dispense based on patient and insurance preference. Use up to four times daily as directed. (FOR ICD-9 250.00, 250.01)., Disp: 1 each, Rfl: 0 .  insulin degludec (TRESIBA FLEXTOUCH) 100 UNIT/ML FlexTouch Pen, Inject 0.2 mLs (20 Units total) into the skin daily., Disp: 5 mL, Rfl: 3 .  Insulin Pen Needle (PEN NEEDLES) 32G X 6 MM MISC, 1 each by Does not apply route daily., Disp: 100 each, Rfl: 3 .  losartan (COZAAR) 25 MG tablet, Take 1 tablet (25 mg total) by mouth daily., Disp: 90 tablet, Rfl: 0 .  Multiple Vitamin (MULTIVITAMIN WITH MINERALS) TABS tablet, Take 1 tablet by mouth daily., Disp: , Rfl:  .  Semaglutide (RYBELSUS) 14 MG TABS, Take 1 tablet by mouth in morning 30 minutes before breakfast, Disp: 90 tablet, Rfl: 1   No Known Allergies   Review of Systems  Constitutional: Negative.   Respiratory: Negative.  Negative for shortness of breath.   Cardiovascular: Negative.  Negative for chest pain, palpitations and leg swelling.  Endocrine: Negative.   Skin: Negative.   Neurological: Negative.  Negative for dizziness and headaches.  Hematological: Negative.   Psychiatric/Behavioral: Negative.  Negative for confusion.  Today's Vitals   02/11/20 1144  BP: 132/84  Pulse: 98  Temp: 97.9 F (36.6 C)  TempSrc: Oral  Weight: 225 lb 3.2 oz (102.2 kg)  Height: 5' 3.2" (1.605 m)  PainSc: 0-No pain   Body mass index is 39.64 kg/m.   Objective:  Physical Exam Constitutional:      General: She is not in acute distress.    Appearance: Normal appearance.  Cardiovascular:     Rate and Rhythm: Normal rate.     Pulses: Normal pulses.     Heart sounds: Normal heart sounds. No murmur heard.   Pulmonary:     Effort: Pulmonary effort is  normal. No respiratory distress.     Breath sounds: Normal breath sounds. No wheezing.  Neurological:     General: No focal deficit present.     Mental Status: She is alert and oriented to person, place, and time.     Cranial Nerves: No cranial nerve deficit.  Psychiatric:        Mood and Affect: Mood normal.        Behavior: Behavior normal.        Thought Content: Thought content normal.        Judgment: Judgment normal.         Assessment And Plan:     1. Uncontrolled type 2 diabetes mellitus with insulin therapy ([place])  Chronic, she has stopped the ozempic and restarted the Rybelsus, had a long discussion about not just stopping a medication and to at least communicate her concerns with me to provide effective care. She would like to continue with Rybelsus, I have sent a new prescription in hopes it is covered.   - CMP14+EGFR - Hemoglobin A1c - Semaglutide (RYBELSUS) 14 MG TABS; Take 1 tablet by mouth in morning 30 minutes before breakfast  Dispense: 90 tablet; Refill: 1 - losartan (COZAAR) 25 MG tablet; Take 1 tablet (25 mg total) by mouth daily.  Dispense: 90 tablet; Refill: 0  2. Mixed hyperlipidemia  Chronic, controlled  Continue with current medications, she is encouraged to take regularly as with diabetes atorvastatin can help protect her heart - Lipid panel - atorvastatin (LIPITOR) 10 MG tablet; Take 1 tablet (10 mg total) by mouth daily.  Dispense: 90 tablet; Refill: 1  3. Encounter for HIV (human immunodeficiency virus) test  Denies known exposure - HIV Antibody (routine testing w rflx)     Patient was given opportunity to ask questions. Patient verbalized understanding of the plan and was able to repeat key elements of the plan. All questions were answered to their satisfaction.   Teola Bradley, FNP, have reviewed all documentation for this visit. The documentation on 02/16/20 for the exam, diagnosis, procedures, and orders are all accurate and complete.    THE PATIENT IS ENCOURAGED TO PRACTICE SOCIAL DISTANCING DUE TO THE COVID-19 PANDEMIC.

## 2020-02-11 NOTE — Patient Instructions (Signed)

## 2020-02-12 LAB — CMP14+EGFR
ALT: 15 IU/L (ref 0–32)
AST: 14 IU/L (ref 0–40)
Albumin/Globulin Ratio: 1.3 (ref 1.2–2.2)
Albumin: 4.4 g/dL (ref 3.8–4.8)
Alkaline Phosphatase: 102 IU/L (ref 44–121)
BUN/Creatinine Ratio: 17 (ref 9–23)
BUN: 12 mg/dL (ref 6–24)
Bilirubin Total: 0.3 mg/dL (ref 0.0–1.2)
CO2: 25 mmol/L (ref 20–29)
Calcium: 9.7 mg/dL (ref 8.7–10.2)
Chloride: 102 mmol/L (ref 96–106)
Creatinine, Ser: 0.72 mg/dL (ref 0.57–1.00)
GFR calc Af Amer: 119 mL/min/{1.73_m2} (ref >59)
GFR calc non Af Amer: 103 mL/min/{1.73_m2} (ref >59)
Globulin, Total: 3.4 g/dL (ref 1.5–4.5)
Glucose: 148 mg/dL — ABNORMAL HIGH (ref 65–99)
Potassium: 4.2 mmol/L (ref 3.5–5.2)
Sodium: 138 mmol/L (ref 134–144)
Total Protein: 7.8 g/dL (ref 6.0–8.5)

## 2020-02-12 LAB — LIPID PANEL
Chol/HDL Ratio: 3.8 ratio (ref 0.0–4.4)
Cholesterol, Total: 200 mg/dL — ABNORMAL HIGH (ref 100–199)
HDL: 53 mg/dL (ref >39)
LDL Chol Calc (NIH): 130 mg/dL — ABNORMAL HIGH (ref 0–99)
Triglycerides: 96 mg/dL (ref 0–149)
VLDL Cholesterol Cal: 17 mg/dL (ref 5–40)

## 2020-02-12 LAB — HIV ANTIBODY (ROUTINE TESTING W REFLEX): HIV Screen 4th Generation wRfx: NONREACTIVE

## 2020-02-12 LAB — HEMOGLOBIN A1C
Est. average glucose Bld gHb Est-mCnc: 192 mg/dL
Hgb A1c MFr Bld: 8.3 % — ABNORMAL HIGH (ref 4.8–5.6)

## 2020-05-05 ENCOUNTER — Other Ambulatory Visit: Payer: Self-pay

## 2020-05-05 ENCOUNTER — Ambulatory Visit (INDEPENDENT_AMBULATORY_CARE_PROVIDER_SITE_OTHER): Payer: BC Managed Care – PPO | Admitting: Nurse Practitioner

## 2020-05-05 ENCOUNTER — Encounter: Payer: Self-pay | Admitting: Nurse Practitioner

## 2020-05-05 VITALS — BP 132/80 | HR 97 | Temp 98.2°F | Ht 63.2 in | Wt 225.4 lb

## 2020-05-05 DIAGNOSIS — E782 Mixed hyperlipidemia: Secondary | ICD-10-CM | POA: Diagnosis not present

## 2020-05-05 DIAGNOSIS — Z794 Long term (current) use of insulin: Secondary | ICD-10-CM

## 2020-05-05 DIAGNOSIS — IMO0002 Reserved for concepts with insufficient information to code with codable children: Secondary | ICD-10-CM

## 2020-05-05 DIAGNOSIS — E1165 Type 2 diabetes mellitus with hyperglycemia: Secondary | ICD-10-CM | POA: Diagnosis not present

## 2020-05-05 DIAGNOSIS — Z0000 Encounter for general adult medical examination without abnormal findings: Secondary | ICD-10-CM

## 2020-05-05 DIAGNOSIS — Z1231 Encounter for screening mammogram for malignant neoplasm of breast: Secondary | ICD-10-CM

## 2020-05-05 LAB — POCT URINALYSIS DIPSTICK
Bilirubin, UA: NEGATIVE
Blood, UA: NEGATIVE
Glucose, UA: NEGATIVE
Ketones, UA: NEGATIVE
Leukocytes, UA: NEGATIVE
Nitrite, UA: NEGATIVE
Protein, UA: NEGATIVE
Spec Grav, UA: 1.01 (ref 1.010–1.025)
Urobilinogen, UA: 0.2 E.U./dL
pH, UA: 5.5 (ref 5.0–8.0)

## 2020-05-05 LAB — POCT UA - MICROALBUMIN
Albumin/Creatinine Ratio, Urine, POC: 30
Creatinine, POC: 100 mg/dL
Microalbumin Ur, POC: 10 mg/L

## 2020-05-05 NOTE — Progress Notes (Signed)
I,Yamilka Roman [employer] as a Education administrator for [employer], FNP.,have documented all relevant documentation on the behalf of [name], FNP,as directed by  [name], FNP while in the presence of [name], [place]. This visit occurred during the SARS-CoV-2 public health emergency.  Safety protocols were in place, including screening questions prior to the visit, additional usage of staff PPE, and extensive cleaning of exam room while observing appropriate contact time as indicated for disinfecting solutions.  Subjective:     Patient ID: Diane Chaney , female    DOB: [birth year] , 45 y.o.   MRN: [id number]   Chief Complaint  Patient presents with  . Annual Exam    HPI  Here for HM. She had to cancel her ophthalmologist appt.    Wt Readings from Last 3 Encounters: 05/05/20 : 225 lb 6.4 oz (102.2 kg) 02/11/20 : 225 lb 3.2 oz (102.2 kg) 11/05/19 : 228 lb 12.8 oz (103.8 kg)  Diabetes She presents for her follow-up diabetic visit. She has type 2 diabetes mellitus. There are no hypoglycemic associated symptoms. Pertinent negatives for hypoglycemia include no confusion, dizziness or headaches. There are no diabetic associated symptoms. Pertinent negatives for diabetes include no chest pain. Symptoms are worsening. Risk factors for coronary artery disease include diabetes mellitus, obesity and sedentary lifestyle. Current diabetic treatment includes oral agent (dual therapy). She is compliant with treatment most of the time. (Reports blood sugars have been in range)     Past Medical History:  Diagnosis Date  . Hyperglycemia      History reviewed. No pertinent family history.   Current Outpatient Medications:  .  acetaminophen (TYLENOL) 500 MG tablet, Take 1,000 mg by mouth every 6 (six) hours as needed for moderate pain., Disp: , Rfl:  .  atorvastatin (LIPITOR) 10 MG tablet, Take 1 tablet (10 mg total) by mouth daily., Disp: 90 tablet, Rfl: 1 .  blood glucose meter kit and  supplies KIT, Dispense based on patient and insurance preference. Use up to four times daily as directed. (FOR ICD-9 250.00, 250.01)., Disp: 1 each, Rfl: 0 .  insulin degludec (TRESIBA FLEXTOUCH) 100 UNIT/ML FlexTouch Pen, Inject 0.2 mLs (20 Units total) into the skin daily., Disp: 5 mL, Rfl: 3 .  Insulin Pen Needle (PEN NEEDLES) 32G X 6 MM MISC, 1 each by Does not apply route daily., Disp: 100 each, Rfl: 3 .  losartan (COZAAR) 25 MG tablet, Take 1 tablet (25 mg total) by mouth daily., Disp: 90 tablet, Rfl: 0 .  Multiple Vitamin (MULTIVITAMIN WITH MINERALS) TABS tablet, Take 1 tablet by mouth daily., Disp: , Rfl:  .  Semaglutide (RYBELSUS) 14 MG TABS, Take 1 tablet by mouth in morning 30 minutes before breakfast, Disp: 90 tablet, Rfl: 1   No Known Allergies    The patient states she is status post hysterectomy   Negative for: breast discharge, breast lump(s), breast pain and breast self exam. Associated symptoms include abnormal vaginal bleeding. Pertinent negatives include abnormal bleeding (hematology), anxiety, decreased libido, depression, difficulty falling sleep, dyspareunia, history of infertility, nocturia, sexual dysfunction, sleep disturbances, urinary incontinence, urinary urgency, vaginal discharge and vaginal itching. Diet regular.The patient states her exercise level is minimal - 1 time a week.   The patient's tobacco use is:  Social History   Tobacco Use  Smoking Status Never Smoker  Smokeless Tobacco Never Used  . She has been exposed to passive smoke. The patient's alcohol use is:  Social History   Substance and Sexual Activity  Alcohol Use Never    Review of Systems  Constitutional: Negative.   HENT: Negative.   Eyes: Negative.   Respiratory: Negative.   Cardiovascular: Negative.  Negative for chest pain.  Gastrointestinal: Negative.   Endocrine: Negative.   Genitourinary: Negative.   Musculoskeletal: Negative.   Skin: Negative.   Allergic/Immunologic: Negative.    Neurological: Negative.  Negative for dizziness and headaches.  Hematological: Negative.   Psychiatric/Behavioral: Negative.  Negative for confusion.     Today's Vitals   05/05/20 1550  BP: 132/80  Pulse: 97  Temp: 98.2 F (36.8 C)  TempSrc: Oral  Weight: 225 lb 6.4 oz (102.2 kg)  Height: 5' 3.2" (1.605 m)  PainSc: 0-No pain   Body mass index is 39.68 kg/m.   Objective:  Physical Exam Constitutional:      General: She is not in acute distress.    Appearance: Normal appearance. She is well-developed. She is obese.  HENT:     Head: Normocephalic and atraumatic.     Right Ear: Hearing, tympanic membrane, ear canal and external ear normal. There is no impacted cerumen.     Left Ear: Hearing, tympanic membrane, ear canal and external ear normal. There is no impacted cerumen.     Nose:     Comments: Deferred - masked    Mouth/Throat:     Comments: Deferred - masked Eyes:     General: Lids are normal.     Extraocular Movements: Extraocular movements intact.     Conjunctiva/sclera: Conjunctivae normal.     Pupils: Pupils are equal, round, and reactive to light.     Funduscopic exam:    Right eye: No papilledema.        Left eye: No papilledema.  Neck:     Thyroid: No thyroid mass.     Vascular: No carotid bruit.  Cardiovascular:     Rate and Rhythm: Normal rate and regular rhythm.     Pulses: Normal pulses.     Heart sounds: Normal heart sounds. No murmur heard.   Pulmonary:     Effort: Pulmonary effort is normal.     Breath sounds: Normal breath sounds.  Chest:     Chest wall: No mass.  Breasts:     Tanner Score is 5.     Right: Normal. No mass, tenderness, axillary adenopathy or supraclavicular adenopathy.     Left: Normal. No mass, tenderness, axillary adenopathy or supraclavicular adenopathy.    Abdominal:     General: Abdomen is flat. Bowel sounds are normal. There is no distension.     Palpations: Abdomen is soft.     Tenderness: There is no abdominal  tenderness.  Genitourinary:    Rectum: Guaiac result negative.  Musculoskeletal:        General: No swelling. Normal range of motion.     Cervical back: Full passive range of motion without pain, normal range of motion and neck supple.     Right lower leg: No edema.     Left lower leg: No edema.  Lymphadenopathy:     Upper Body:     Right upper body: No supraclavicular, axillary or pectoral adenopathy.     Left upper body: No supraclavicular, axillary or pectoral adenopathy.  Skin:    General: Skin is warm and dry.     Capillary Refill: Capillary refill takes less than 2 seconds.  Neurological:     General: No focal deficit present.     Mental Status: She is alert and oriented to  person, place, and time.     Cranial Nerves: No cranial nerve deficit.     Sensory: No sensory deficit.  Psychiatric:        Mood and Affect: Mood normal.        Behavior: Behavior normal.        Thought Content: Thought content normal.        Judgment: Judgment normal.         Assessment And Plan:     1. Encounter for general adult medical examination w/o abnormal findings . Behavior modifications discussed and diet history reviewed.   . Pt will continue to exercise regularly and modify diet with low GI, plant based foods and decrease intake of processed foods.  . Recommend intake of daily multivitamin, Vitamin D, and calcium.  . Recommend mammogram for preventive screenings, as well as recommend immunizations that include influenza, TDAP (up to date) - CBC  2. Uncontrolled type 2 diabetes mellitus with insulin therapy ([place])  Chronic, she is tolerating her medications well  HgbA1c improved since last visit  She is encouraged to increase her physical activity - POCT Urinalysis Dipstick (81002) - POCT UA - Microalbumin - EKG 12-Lead - CBC - CMP14+EGFR - Hemoglobin A1c  3. Mixed hyperlipidemia  Chronic, controlled  Tolerating current medications well without muscle aches or cramps. -  Lipid panel - CBC  4. Encounter for screening mammogram for malignant neoplasm of breast  Pt instructed on Self Breast Exam.According to ACOG guidelines Women aged 45 and older are recommended to get an annual mammogram.Order placed at [facility] for appointment scheduling.   Pt encouraged to get annual mammogram - MM Digital Screening; Future     Patient was given opportunity to ask questions. Patient verbalized understanding of the plan and was able to repeat key elements of the plan. All questions were answered to their satisfaction.   [name], FNP   I, [name], FNP, have reviewed all documentation for this visit. The documentation on 05/05/20 for the exam, diagnosis, procedures, and orders are all accurate and complete.   THE PATIENT IS ENCOURAGED TO PRACTICE SOCIAL DISTANCING DUE TO THE COVID-19 PANDEMIC.

## 2020-05-05 NOTE — Patient Instructions (Signed)
Health Maintenance, Female Adopting a healthy lifestyle and getting preventive care are important in promoting health and wellness. Ask your health care provider about:  The right schedule for you to have regular tests and exams.  Things you can do on your own to prevent diseases and keep yourself healthy. What should I know about diet, weight, and exercise? Eat a healthy diet  Eat a diet that includes plenty of vegetables, fruits, low-fat dairy products, and lean protein.  Do not eat a lot of foods that are high in solid fats, added sugars, or sodium.   Maintain a healthy weight Body mass index (BMI) is used to identify weight problems. It estimates body fat based on height and weight. Your health care provider can help determine your BMI and help you achieve or maintain a healthy weight. Get regular exercise Get regular exercise. This is one of the most important things you can do for your health. Most adults should:  Exercise for at least 150 minutes each week. The exercise should increase your heart rate and make you sweat (moderate-intensity exercise).  Do strengthening exercises at least twice a week. This is in addition to the moderate-intensity exercise.  Spend less time sitting. Even light physical activity can be beneficial. Watch cholesterol and blood lipids Have your blood tested for lipids and cholesterol at 44 years of age, then have this test every 5 years. Have your cholesterol levels checked more often if:  Your lipid or cholesterol levels are high.  You are older than 44 years of age.  You are at high risk for heart disease. What should I know about cancer screening? Depending on your health history and family history, you may need to have cancer screening at various ages. This may include screening for:  Breast cancer.  Cervical cancer.  Colorectal cancer.  Skin cancer.  Lung cancer. What should I know about heart disease, diabetes, and high blood  pressure? Blood pressure and heart disease  High blood pressure causes heart disease and increases the risk of stroke. This is more likely to develop in people who have high blood pressure readings, are of African descent, or are overweight.  Have your blood pressure checked: ? Every 3-5 years if you are 18-39 years of age. ? Every year if you are 40 years old or older. Diabetes Have regular diabetes screenings. This checks your fasting blood sugar level. Have the screening done:  Once every three years after age 40 if you are at a normal weight and have a low risk for diabetes.  More often and at a younger age if you are overweight or have a high risk for diabetes. What should I know about preventing infection? Hepatitis B If you have a higher risk for hepatitis B, you should be screened for this virus. Talk with your health care provider to find out if you are at risk for hepatitis B infection. Hepatitis C Testing is recommended for:  Everyone born from 1945 through 1965.  Anyone with known risk factors for hepatitis C. Sexually transmitted infections (STIs)  Get screened for STIs, including gonorrhea and chlamydia, if: ? You are sexually active and are younger than 44 years of age. ? You are older than 44 years of age and your health care provider tells you that you are at risk for this type of infection. ? Your sexual activity has changed since you were last screened, and you are at increased risk for chlamydia or gonorrhea. Ask your health care provider   if you are at risk.  Ask your health care provider about whether you are at high risk for HIV. Your health care provider may recommend a prescription medicine to help prevent HIV infection. If you choose to take medicine to prevent HIV, you should first get tested for HIV. You should then be tested every 3 months for as long as you are taking the medicine. Pregnancy  If you are about to stop having your period (premenopausal) and  you may become pregnant, seek counseling before you get pregnant.  Take 400 to 800 micrograms (mcg) of folic acid every day if you become pregnant.  Ask for birth control (contraception) if you want to prevent pregnancy. Osteoporosis and menopause Osteoporosis is a disease in which the bones lose minerals and strength with aging. This can result in bone fractures. If you are 65 years old or older, or if you are at risk for osteoporosis and fractures, ask your health care provider if you should:  Be screened for bone loss.  Take a calcium or vitamin D supplement to lower your risk of fractures.  Be given hormone replacement therapy (HRT) to treat symptoms of menopause. Follow these instructions at home: Lifestyle  Do not use any products that contain nicotine or tobacco, such as cigarettes, e-cigarettes, and chewing tobacco. If you need help quitting, ask your health care provider.  Do not use street drugs.  Do not share needles.  Ask your health care provider for help if you need support or information about quitting drugs. Alcohol use  Do not drink alcohol if: ? Your health care provider tells you not to drink. ? You are pregnant, may be pregnant, or are planning to become pregnant.  If you drink alcohol: ? Limit how much you use to 0-1 drink a day. ? Limit intake if you are breastfeeding.  Be aware of how much alcohol is in your drink. In the [place] one drink equals one 12 oz bottle of beer (355 mL), one 5 oz glass of wine (148 mL), or one 1 oz glass of hard liquor (44 mL). General instructions  Schedule regular health, dental, and eye exams.  Stay current with your vaccines.  Tell your health care provider if: ? You often feel depressed. ? You have ever been abused or do not feel safe at home. Summary  Adopting a healthy lifestyle and getting preventive care are important in promoting health and wellness.  Follow your health care provider's instructions about healthy  diet, exercising, and getting tested or screened for diseases.  Follow your health care provider's instructions on monitoring your cholesterol and blood pressure. This information is not intended to replace advice given to you by your health care provider. Make sure you discuss any questions you have with your health care provider. Document Revised: 02/28/2018 Document Reviewed: 02/28/2018 Elsevier Patient Education  2021 Elsevier Inc.  

## 2020-05-06 LAB — CBC
Hematocrit: 37.5 % (ref 34.0–46.6)
Hemoglobin: 11.9 g/dL (ref 11.1–15.9)
MCH: 23.1 pg — ABNORMAL LOW (ref 26.6–33.0)
MCHC: 31.7 g/dL (ref 31.5–35.7)
MCV: 73 fL — ABNORMAL LOW (ref 79–97)
Platelets: 357 10*3/uL (ref 150–450)
RBC: 5.15 x10E6/uL (ref 3.77–5.28)
RDW: 15.2 % (ref 11.7–15.4)
WBC: 8.4 10*3/uL (ref 3.4–10.8)

## 2020-05-06 LAB — HEMOGLOBIN A1C
Est. average glucose Bld gHb Est-mCnc: 189 mg/dL
Hgb A1c MFr Bld: 8.2 % — ABNORMAL HIGH (ref 4.8–5.6)

## 2020-05-06 LAB — CMP14+EGFR
ALT: 13 IU/L (ref 0–32)
AST: 18 IU/L (ref 0–40)
Albumin/Globulin Ratio: 1.3 (ref 1.2–2.2)
Albumin: 4.3 g/dL (ref 3.8–4.8)
Alkaline Phosphatase: 104 IU/L (ref 44–121)
BUN/Creatinine Ratio: 17 (ref 9–23)
BUN: 12 mg/dL (ref 6–24)
Bilirubin Total: 0.2 mg/dL (ref 0.0–1.2)
CO2: 19 mmol/L — ABNORMAL LOW (ref 20–29)
Calcium: 9.5 mg/dL (ref 8.7–10.2)
Chloride: 100 mmol/L (ref 96–106)
Creatinine, Ser: 0.72 mg/dL (ref 0.57–1.00)
GFR calc Af Amer: 119 mL/min/{1.73_m2} (ref >59)
GFR calc non Af Amer: 103 mL/min/{1.73_m2} (ref >59)
Globulin, Total: 3.2 g/dL (ref 1.5–4.5)
Glucose: 153 mg/dL — ABNORMAL HIGH (ref 65–99)
Potassium: 4.6 mmol/L (ref 3.5–5.2)
Sodium: 139 mmol/L (ref 134–144)
Total Protein: 7.5 g/dL (ref 6.0–8.5)

## 2020-05-06 LAB — LIPID PANEL
Chol/HDL Ratio: 3.9 ratio (ref 0.0–4.4)
Cholesterol, Total: 193 mg/dL (ref 100–199)
HDL: 49 mg/dL (ref >39)
LDL Chol Calc (NIH): 121 mg/dL — ABNORMAL HIGH (ref 0–99)
Triglycerides: 130 mg/dL (ref 0–149)
VLDL Cholesterol Cal: 23 mg/dL (ref 5–40)

## 2020-05-12 ENCOUNTER — Other Ambulatory Visit: Payer: Self-pay | Admitting: Nurse Practitioner

## 2020-05-12 DIAGNOSIS — E1165 Type 2 diabetes mellitus with hyperglycemia: Secondary | ICD-10-CM

## 2020-05-12 DIAGNOSIS — IMO0002 Reserved for concepts with insufficient information to code with codable children: Secondary | ICD-10-CM

## 2020-05-13 ENCOUNTER — Ambulatory Visit: Payer: BC Managed Care – PPO | Admitting: Nurse Practitioner

## 2020-08-06 ENCOUNTER — Other Ambulatory Visit: Payer: Self-pay

## 2020-08-06 ENCOUNTER — Ambulatory Visit: Payer: BC Managed Care – PPO | Admitting: Nurse Practitioner

## 2020-08-06 ENCOUNTER — Encounter: Payer: Self-pay | Admitting: Nurse Practitioner

## 2020-08-06 VITALS — BP 128/80 | HR 85 | Temp 98.6°F | Ht 63.2 in | Wt 224.6 lb

## 2020-08-06 DIAGNOSIS — Z794 Long term (current) use of insulin: Secondary | ICD-10-CM

## 2020-08-06 DIAGNOSIS — E1165 Type 2 diabetes mellitus with hyperglycemia: Secondary | ICD-10-CM

## 2020-08-06 DIAGNOSIS — IMO0002 Reserved for concepts with insufficient information to code with codable children: Secondary | ICD-10-CM

## 2020-08-06 DIAGNOSIS — E782 Mixed hyperlipidemia: Secondary | ICD-10-CM | POA: Diagnosis not present

## 2020-08-06 MED ORDER — RYBELSUS 14 MG PO TABS: ORAL_TABLET | ORAL | 1 refills | Status: DC

## 2020-08-06 MED ORDER — TRESIBA FLEXTOUCH 100 UNIT/ML ~~LOC~~ SOPN: 20.0000 [IU] | PEN_INJECTOR | Freq: Every day | SUBCUTANEOUS | 3 refills | Status: DC

## 2020-08-06 NOTE — Patient Instructions (Signed)
Diabetes Mellitus and Nutrition, Adult When you have diabetes, or diabetes mellitus, it is very important to have healthy eating habits because your blood sugar (glucose) levels are greatly affected by what you eat and drink. Eating healthy foods in the right amounts, at about the same times every day, can help you:  Control your blood glucose.  Lower your risk of heart disease.  Improve your blood pressure.  Reach or maintain a healthy weight. What can affect my meal plan? Every person with diabetes is different, and each person has different needs for a meal plan. Your health care provider may recommend that you work with a dietitian to make a meal plan that is best for you. Your meal plan may vary depending on factors such as:  The calories you need.  The medicines you take.  Your weight.  Your blood glucose, blood pressure, and cholesterol levels.  Your activity level.  Other health conditions you have, such as heart or kidney disease. How do carbohydrates affect me? Carbohydrates, also called carbs, affect your blood glucose level more than any other type of food. Eating carbs naturally raises the amount of glucose in your blood. Carb counting is a method for keeping track of how many carbs you eat. Counting carbs is important to keep your blood glucose at a healthy level, especially if you use insulin or take certain oral diabetes medicines. It is important to know how many carbs you can safely have in each meal. This is different for every person. Your dietitian can help you calculate how many carbs you should have at each meal and for each snack. How does alcohol affect me? Alcohol can cause a sudden decrease in blood glucose (hypoglycemia), especially if you use insulin or take certain oral diabetes medicines. Hypoglycemia can be a life-threatening condition. Symptoms of hypoglycemia, such as sleepiness, dizziness, and confusion, are similar to symptoms of having too much  alcohol.  Do not drink alcohol if: ? Your health care provider tells you not to drink. ? You are pregnant, may be pregnant, or are planning to become pregnant.  If you drink alcohol: ? Do not drink on an empty stomach. ? Limit how much you use to:  0-1 drink a day for women.  0-2 drinks a day for men. ? Be aware of how much alcohol is in your drink. In the [place] one drink equals one 12 oz bottle of beer (355 mL), one 5 oz glass of wine (148 mL), or one 1 oz glass of hard liquor (44 mL). ? Keep yourself hydrated with water, diet soda, or unsweetened iced tea.  Keep in mind that regular soda, juice, and other mixers may contain a lot of sugar and must be counted as carbs. What are tips for following this plan? Reading food labels  Start by checking the serving size on the "Nutrition Facts" label of packaged foods and drinks. The amount of calories, carbs, fats, and other nutrients listed on the label is based on one serving of the item. Many items contain more than one serving per package.  Check the total grams (g) of carbs in one serving. You can calculate the number of servings of carbs in one serving by dividing the total carbs by 15. For example, if a food has 30 g of total carbs per serving, it would be equal to 2 servings of carbs.  Check the number of grams (g) of saturated fats and trans fats in one serving. Choose foods that have   a low amount or none of these fats.  Check the number of milligrams (mg) of salt (sodium) in one serving. Most people should limit total sodium intake to less than 2,300 mg per day.  Always check the nutrition information of foods labeled as "low-fat" or "nonfat." These foods may be higher in added sugar or refined carbs and should be avoided.  Talk to your dietitian to identify your daily goals for nutrients listed on the label. Shopping  Avoid buying canned, pre-made, or processed foods. These foods tend to be high in fat, sodium, and added  sugar.  Shop around the outside edge of the grocery store. This is where you will most often find fresh fruits and vegetables, bulk grains, fresh meats, and fresh dairy. Cooking  Use low-heat cooking methods, such as baking, instead of high-heat cooking methods like deep frying.  Cook using healthy oils, such as olive, canola, or sunflower oil.  Avoid cooking with butter, cream, or high-fat meats. Meal planning  Eat meals and snacks regularly, preferably at the same times every day. Avoid going long periods of time without eating.  Eat foods that are high in fiber, such as fresh fruits, vegetables, beans, and whole grains. Talk with your dietitian about how many servings of carbs you can eat at each meal.  Eat 4-6 oz (112-168 g) of lean protein each day, such as lean meat, chicken, fish, eggs, or tofu. One ounce (oz) of lean protein is equal to: ? 1 oz (28 g) of meat, chicken, or fish. ? 1 egg. ?  cup (62 g) of tofu.  Eat some foods each day that contain healthy fats, such as avocado, nuts, seeds, and fish.   What foods should I eat? Fruits Berries. Apples. Oranges. Peaches. Apricots. Plums. Grapes. Mango. Papaya. Pomegranate. Kiwi. Cherries. Vegetables Lettuce. Spinach. Leafy greens, including kale, chard, collard greens, and mustard greens. Beets. Cauliflower. Cabbage. Broccoli. Carrots. Green beans. Tomatoes. Peppers. Onions. Cucumbers. Brussels sprouts. Grains Whole grains, such as whole-wheat or whole-grain bread, crackers, tortillas, cereal, and pasta. Unsweetened oatmeal. Quinoa. Brown or wild rice. Meats and other proteins Seafood. Poultry without skin. Lean cuts of poultry and beef. Tofu. Nuts. Seeds. Dairy Low-fat or fat-free dairy products such as milk, yogurt, and cheese. The items listed above may not be a complete list of foods and beverages you can eat. Contact a dietitian for more information. What foods should I avoid? Fruits Fruits canned with  syrup. Vegetables Canned vegetables. Frozen vegetables with butter or cream sauce. Grains Refined white flour and flour products such as bread, pasta, snack foods, and cereals. Avoid all processed foods. Meats and other proteins Fatty cuts of meat. Poultry with skin. Breaded or fried meats. Processed meat. Avoid saturated fats. Dairy Full-fat yogurt, cheese, or milk. Beverages Sweetened drinks, such as soda or iced tea. The items listed above may not be a complete list of foods and beverages you should avoid. Contact a dietitian for more information. Questions to ask a health care provider  Do I need to meet with a diabetes educator?  Do I need to meet with a dietitian?  What number can I call if I have questions?  When are the best times to check my blood glucose? Where to find more information:  American Diabetes Association: diabetes.org  Academy of Nutrition and Dietetics: www.eatright.org  National Institute of Diabetes and Digestive and Kidney Diseases: www.niddk.nih.gov  Association of Diabetes Care and Education Specialists: www.diabeteseducator.org Summary  It is important to have healthy eating   habits because your blood sugar (glucose) levels are greatly affected by what you eat and drink.  A healthy meal plan will help you control your blood glucose and maintain a healthy lifestyle.  Your health care provider may recommend that you work with a dietitian to make a meal plan that is best for you.  Keep in mind that carbohydrates (carbs) and alcohol have immediate effects on your blood glucose levels. It is important to count carbs and to use alcohol carefully. This information is not intended to replace advice given to you by your health care provider. Make sure you discuss any questions you have with your health care provider. Document Revised: 02/12/2019 Document Reviewed: 02/12/2019 Elsevier Patient Education  2021 Elsevier Inc.  

## 2020-08-06 NOTE — Progress Notes (Signed)
I,Tianna Badgett,acting as a Education administrator for [employer], FNP.,have documented all relevant documentation on the behalf of [name], FNP,as directed by  [name], FNP while in the presence of [name], [place].  This visit occurred during the SARS-CoV-2 public health emergency.  Safety protocols were in place, including screening questions prior to the visit, additional usage of staff PPE, and extensive cleaning of exam room while observing appropriate contact time as indicated for disinfecting solutions.  Subjective:     Patient ID: Diane Chaney , female    DOB: 11-29-1975 , 45 y.o.   MRN: [id number]   Chief Complaint  Patient presents with   Diabetes    HPI  Patient is here for DM follow up.  She feels she needs a counselor to focus on her behaviors. She plans to find a counselor who works with her   Diabetes She presents for her follow-up diabetic visit. She has type 2 diabetes mellitus. There are no hypoglycemic associated symptoms. Pertinent negatives for hypoglycemia include no confusion, dizziness or headaches. There are no diabetic associated symptoms. Pertinent negatives for diabetes include no chest pain. Symptoms are worsening. Risk factors for coronary artery disease include sedentary lifestyle, obesity and diabetes mellitus. Current diabetic treatment includes oral agent (dual therapy). She has not had a previous visit with a dietitian. (She is not checking her blood sugar recently) An ACE inhibitor/angiotensin II receptor blocker is being taken. She does not see a podiatrist.Eye exam is not current.    Past Medical History:  Diagnosis Date   Hyperglycemia      No family history on file.   Current Outpatient Medications:    acetaminophen (TYLENOL) 500 MG tablet, Take 1,000 mg by mouth every 6 (six) hours as needed for moderate pain., Disp: , Rfl:    blood glucose meter kit and supplies KIT, Dispense based on patient and insurance preference. Use up to four times  daily as directed. (FOR ICD-9 250.00, 250.01)., Disp: 1 each, Rfl: 0   Insulin Pen Needle (PEN NEEDLES) 32G X 6 MM MISC, 1 each by Does not apply route daily., Disp: 100 each, Rfl: 3   losartan (COZAAR) 25 MG tablet, TAKE 1 TABLET BY MOUTH EVERY DAY, Disp: 90 tablet, Rfl: 0   Multiple Vitamin (MULTIVITAMIN WITH MINERALS) TABS tablet, Take 1 tablet by mouth daily., Disp: , Rfl:    atorvastatin (LIPITOR) 20 MG tablet, Take 1 tablet (20 mg total) by mouth daily., Disp: 90 tablet, Rfl: 1   insulin degludec (TRESIBA FLEXTOUCH) 100 UNIT/ML FlexTouch Pen, Inject 20 Units into the skin daily., Disp: 5 mL, Rfl: 3   Semaglutide (RYBELSUS) 14 MG TABS, Take 1 tablet by mouth in morning 30 minutes before breakfast, Disp: 90 tablet, Rfl: 1   No Known Allergies   Review of Systems  Constitutional: Negative.   Respiratory: Negative.  Negative for cough and wheezing.   Cardiovascular: Negative.  Negative for chest pain, palpitations and leg swelling.  Gastrointestinal: Negative.   Neurological: Negative.  Negative for dizziness and headaches.  Psychiatric/Behavioral:  Negative for agitation and confusion.     Today's Vitals   08/06/20 1627  BP: 128/80  Pulse: 85  Temp: 98.6 F (37 C)  TempSrc: Oral  Weight: 224 lb 9.6 oz (101.9 kg)  Height: 5' 3.2" (1.605 m)  PainSc: 0-No pain   Body mass index is 39.53 kg/m.   Objective:  Physical Exam Constitutional:      General: She is not in acute distress.  Appearance: Normal appearance. She is obese.  Cardiovascular:     Rate and Rhythm: Normal rate and regular rhythm.     Pulses: Normal pulses.     Heart sounds: Normal heart sounds. No murmur heard. Pulmonary:     Effort: Pulmonary effort is normal. No respiratory distress.     Breath sounds: Normal breath sounds. No wheezing.  Skin:    General: Skin is warm and dry.     Coloration: Skin is not jaundiced.     Findings: No bruising.  Neurological:     General: No focal deficit present.      Mental Status: She is alert and oriented to person, place, and time.     Cranial Nerves: No cranial nerve deficit.     Motor: No weakness.  Psychiatric:        Mood and Affect: Mood normal.        Behavior: Behavior normal.        Thought Content: Thought content normal.        Judgment: Judgment normal.        Assessment And Plan:     1. Uncontrolled type 2 diabetes mellitus with insulin therapy ([place]) Chronic, discussed with patient if her HgbA1c is still elevated we may need to add another medication.  Continues to tolerate rybelsus - Hemoglobin A1c - CMP14+EGFR - insulin degludec (TRESIBA FLEXTOUCH) 100 UNIT/ML FlexTouch Pen; Inject 20 Units into the skin daily.  Dispense: 5 mL; Refill: 3 - Semaglutide (RYBELSUS) 14 MG TABS; Take 1 tablet by mouth in morning 30 minutes before breakfast  Dispense: 90 tablet; Refill: 1  2. Mixed hyperlipidemia Chronic, controlled She is willing to start atorvastatin, discussed side effects to include muscle weakness or leg cramps - CMP14+EGFR - Lipid panel     Patient was given opportunity to ask questions. Patient verbalized understanding of the plan and was able to repeat key elements of the plan. All questions were answered to their satisfaction.  [name], FNP    I, [name], FNP, have reviewed all documentation for this visit. The documentation on 08/06/20 for the exam, diagnosis, procedures, and orders are all accurate and complete.   IF YOU HAVE BEEN REFERRED TO A SPECIALIST, IT MAY TAKE 1-2 WEEKS TO SCHEDULE/PROCESS THE REFERRAL. IF YOU HAVE NOT HEARD FROM US/SPECIALIST IN TWO WEEKS, PLEASE GIVE [place] A CALL AT [phone].   THE PATIENT IS ENCOURAGED TO PRACTICE SOCIAL DISTANCING DUE TO THE COVID-19 PANDEMIC.

## 2020-08-07 LAB — HEMOGLOBIN A1C
Est. average glucose Bld gHb Est-mCnc: 163 mg/dL
Hgb A1c MFr Bld: 7.3 % — ABNORMAL HIGH (ref 4.8–5.6)

## 2020-08-07 LAB — CMP14+EGFR
ALT: 13 IU/L (ref 0–32)
AST: 15 IU/L (ref 0–40)
Albumin/Globulin Ratio: 1.4 (ref 1.2–2.2)
Albumin: 4.5 g/dL (ref 3.8–4.8)
Alkaline Phosphatase: 103 IU/L (ref 44–121)
BUN/Creatinine Ratio: 17 (ref 9–23)
BUN: 12 mg/dL (ref 6–24)
Bilirubin Total: 0.2 mg/dL (ref 0.0–1.2)
CO2: 23 mmol/L (ref 20–29)
Calcium: 9.5 mg/dL (ref 8.7–10.2)
Chloride: 100 mmol/L (ref 96–106)
Creatinine, Ser: 0.71 mg/dL (ref 0.57–1.00)
Globulin, Total: 3.3 g/dL (ref 1.5–4.5)
Glucose: 105 mg/dL — ABNORMAL HIGH (ref 65–99)
Potassium: 4.3 mmol/L (ref 3.5–5.2)
Sodium: 138 mmol/L (ref 134–144)
Total Protein: 7.8 g/dL (ref 6.0–8.5)
eGFR: 108 mL/min/{1.73_m2} (ref >59)

## 2020-08-07 LAB — LIPID PANEL
Chol/HDL Ratio: 3.6 ratio (ref 0.0–4.4)
Cholesterol, Total: 189 mg/dL (ref 100–199)
HDL: 52 mg/dL (ref >39)
LDL Chol Calc (NIH): 119 mg/dL — ABNORMAL HIGH (ref 0–99)
Triglycerides: 101 mg/dL (ref 0–149)
VLDL Cholesterol Cal: 18 mg/dL (ref 5–40)

## 2020-08-10 ENCOUNTER — Other Ambulatory Visit: Payer: Self-pay | Admitting: Nurse Practitioner

## 2020-08-10 DIAGNOSIS — E782 Mixed hyperlipidemia: Secondary | ICD-10-CM

## 2020-08-10 MED ORDER — ATORVASTATIN CALCIUM 20 MG PO TABS: 20.0000 mg | ORAL_TABLET | Freq: Every day | ORAL | 1 refills | Status: DC

## 2020-08-29 ENCOUNTER — Ambulatory Visit: Payer: BC Managed Care – PPO

## 2020-08-31 ENCOUNTER — Ambulatory Visit
Admission: RE | Admit: 2020-08-31 | Discharge: 2020-08-31 | Disposition: A | Discharge status: Home / Self Care | Payer: BC Managed Care – PPO | Source: Ambulatory Visit | Attending: Nurse Practitioner | Admitting: Nurse Practitioner

## 2020-08-31 ENCOUNTER — Other Ambulatory Visit: Payer: Self-pay

## 2020-08-31 DIAGNOSIS — Z1231 Encounter for screening mammogram for malignant neoplasm of breast: Secondary | ICD-10-CM

## 2020-09-05 ENCOUNTER — Other Ambulatory Visit: Payer: Self-pay | Admitting: Nurse Practitioner

## 2020-09-05 DIAGNOSIS — IMO0002 Reserved for concepts with insufficient information to code with codable children: Secondary | ICD-10-CM

## 2020-09-08 ENCOUNTER — Encounter: Payer: Self-pay | Admitting: Nurse Practitioner

## 2020-09-19 ENCOUNTER — Other Ambulatory Visit: Payer: Self-pay | Admitting: Nurse Practitioner

## 2020-09-19 DIAGNOSIS — E782 Mixed hyperlipidemia: Secondary | ICD-10-CM

## 2020-11-10 ENCOUNTER — Ambulatory Visit: Payer: BC Managed Care – PPO | Admitting: Nurse Practitioner

## 2020-12-30 ENCOUNTER — Other Ambulatory Visit: Payer: Self-pay | Admitting: Nurse Practitioner

## 2020-12-30 DIAGNOSIS — E1165 Type 2 diabetes mellitus with hyperglycemia: Secondary | ICD-10-CM

## 2021-01-11 ENCOUNTER — Encounter: Payer: Self-pay | Admitting: Nurse Practitioner

## 2021-01-11 ENCOUNTER — Other Ambulatory Visit: Payer: Self-pay

## 2021-01-11 ENCOUNTER — Ambulatory Visit: Payer: BC Managed Care – PPO | Admitting: Nurse Practitioner

## 2021-01-11 VITALS — BP 118/68 | HR 70 | Temp 98.4°F | Ht 63.6 in | Wt 226.8 lb

## 2021-01-11 DIAGNOSIS — Z2821 Immunization not carried out because of patient refusal: Secondary | ICD-10-CM

## 2021-01-11 DIAGNOSIS — Z794 Long term (current) use of insulin: Secondary | ICD-10-CM

## 2021-01-11 DIAGNOSIS — E1165 Type 2 diabetes mellitus with hyperglycemia: Secondary | ICD-10-CM

## 2021-01-11 DIAGNOSIS — Z818 Family history of other mental and behavioral disorders: Secondary | ICD-10-CM

## 2021-01-11 DIAGNOSIS — E782 Mixed hyperlipidemia: Secondary | ICD-10-CM

## 2021-01-11 DIAGNOSIS — R4586 Emotional lability: Secondary | ICD-10-CM | POA: Diagnosis not present

## 2021-01-11 DIAGNOSIS — Z6839 Body mass index (BMI) 39.0-39.9, adult: Secondary | ICD-10-CM

## 2021-01-11 LAB — HEMOGLOBIN A1C
Est. average glucose Bld gHb Est-mCnc: 186 mg/dL
Hgb A1c MFr Bld: 8.1 % — ABNORMAL HIGH (ref 4.8–5.6)

## 2021-01-11 LAB — CMP14+EGFR
ALT: 14 IU/L (ref 0–32)
AST: 15 IU/L (ref 0–40)
Albumin/Globulin Ratio: 1.3 (ref 1.2–2.2)
Albumin: 4.2 g/dL (ref 3.8–4.8)
Alkaline Phosphatase: 104 IU/L (ref 44–121)
BUN/Creatinine Ratio: 19 (ref 9–23)
BUN: 13 mg/dL (ref 6–24)
Bilirubin Total: 0.3 mg/dL (ref 0.0–1.2)
CO2: 23 mmol/L (ref 20–29)
Calcium: 9.4 mg/dL (ref 8.7–10.2)
Chloride: 103 mmol/L (ref 96–106)
Creatinine, Ser: 0.67 mg/dL (ref 0.57–1.00)
Globulin, Total: 3.3 g/dL (ref 1.5–4.5)
Glucose: 117 mg/dL — ABNORMAL HIGH (ref 70–99)
Potassium: 4.5 mmol/L (ref 3.5–5.2)
Sodium: 141 mmol/L (ref 134–144)
Total Protein: 7.5 g/dL (ref 6.0–8.5)
eGFR: 110 mL/min/{1.73_m2} (ref >59)

## 2021-01-11 LAB — LIPID PANEL
Chol/HDL Ratio: 3.7 ratio (ref 0.0–4.4)
Cholesterol, Total: 191 mg/dL (ref 100–199)
HDL: 52 mg/dL (ref >39)
LDL Chol Calc (NIH): 120 mg/dL — ABNORMAL HIGH (ref 0–99)
Triglycerides: 105 mg/dL (ref 0–149)
VLDL Cholesterol Cal: 19 mg/dL (ref 5–40)

## 2021-01-11 NOTE — Patient Instructions (Signed)

## 2021-01-11 NOTE — Progress Notes (Signed)
I,Tianna Badgett,acting as a Education administrator for [employer], FNP.,have documented all relevant documentation on the behalf of [name], FNP,as directed by  [name], FNP while in the presence of [name], [place].  This visit occurred during the SARS-CoV-2 public health emergency.  Safety protocols were in place, including screening questions prior to the visit, additional usage of staff PPE, and extensive cleaning of exam room while observing appropriate contact time as indicated for disinfecting solutions.  Subjective:     Patient ID: Diane Chaney , female    DOB: 12-Dec-1975 , 45 y.o.   MRN: [id number]   Chief Complaint  Patient presents with   Diabetes    HPI  Patient is here for DM follow up.  Continues taking Tresiba 20 units daily and Rybelsus 14 mg daily. She missed her 3 month f/u in August. She admits to not doing well with her diet and exercise. She reports in the summer she was exercising more. Denies feeling depression but is refusing to deal with the eating healthier. Her mother had a history of bipolar and schizophrenia. She has been to counseling last year dealing with the death of her mother. She admits to helping others than helping herself.   Wt Readings from Last 3 Encounters: 01/11/21 : 226 lb 12.8 oz (102.9 kg) 08/06/20 : 224 lb 9.6 oz (101.9 kg) 05/05/20 : 225 lb 6.4 oz (102.2 kg)    Diabetes She presents for her follow-up diabetic visit. She has type 2 diabetes mellitus. There are no hypoglycemic associated symptoms. Pertinent negatives for hypoglycemia include no confusion, dizziness or headaches. There are no diabetic associated symptoms. Pertinent negatives for diabetes include no chest pain, no polydipsia, no polyphagia and no polyuria. Symptoms are worsening. Risk factors for coronary artery disease include sedentary lifestyle, obesity and diabetes mellitus. Current diabetic treatment includes oral agent (dual therapy). She has not had a previous visit with  a dietitian. (She is not checking her blood sugar ) An ACE inhibitor/angiotensin II receptor blocker is being taken. She does not see a podiatrist.Eye exam is not current.    Past Medical History:  Diagnosis Date   Hyperglycemia      History reviewed. No pertinent family history.   Current Outpatient Medications:    acetaminophen (TYLENOL) 500 MG tablet, Take 1,000 mg by mouth every 6 (six) hours as needed for moderate pain., Disp: , Rfl:    atorvastatin (LIPITOR) 20 MG tablet, Take 1 tablet (20 mg total) by mouth daily., Disp: 90 tablet, Rfl: 1   blood glucose meter kit and supplies KIT, Dispense based on patient and insurance preference. Use up to four times daily as directed. (FOR ICD-9 250.00, 250.01)., Disp: 1 each, Rfl: 0   insulin degludec (TRESIBA FLEXTOUCH) 100 UNIT/ML FlexTouch Pen, Inject 20 Units into the skin daily., Disp: 5 mL, Rfl: 3   Insulin Pen Needle (PEN NEEDLES) 32G X 6 MM MISC, 1 each by Does not apply route daily., Disp: 100 each, Rfl: 3   losartan (COZAAR) 25 MG tablet, TAKE 1 TABLET BY MOUTH EVERY DAY, Disp: 90 tablet, Rfl: 0   Multiple Vitamin (MULTIVITAMIN WITH MINERALS) TABS tablet, Take 1 tablet by mouth daily., Disp: , Rfl:    Semaglutide (RYBELSUS) 14 MG TABS, Take 1 tablet by mouth in morning 30 minutes before breakfast, Disp: 90 tablet, Rfl: 1   No Known Allergies   Review of Systems  Constitutional: Negative.  Negative for chills.  Respiratory: Negative.  Negative for cough.   Cardiovascular:  Negative.  Negative for chest pain, palpitations and leg swelling.  Gastrointestinal: Negative.   Endocrine: Negative for polydipsia, polyphagia and polyuria.  Neurological: Negative.  Negative for dizziness and headaches.  Psychiatric/Behavioral:  Negative for confusion.     Today's Vitals   01/11/21 0846  BP: 118/68  Pulse: 70  Temp: 98.4 F (36.9 C)  TempSrc: Oral  Weight: 226 lb 12.8 oz (102.9 kg)  Height: 5' 3.6" (1.615 m)   Body mass index is 39.42  kg/m.  Wt Readings from Last 3 Encounters:  01/11/21 226 lb 12.8 oz (102.9 kg)  08/06/20 224 lb 9.6 oz (101.9 kg)  05/05/20 225 lb 6.4 oz (102.2 kg)    Objective:  Physical Exam Vitals reviewed.  Constitutional:      General: She is not in acute distress.    Appearance: Normal appearance. She is obese.  Cardiovascular:     Rate and Rhythm: Normal rate and regular rhythm.     Pulses: Normal pulses.     Heart sounds: Normal heart sounds. No murmur heard. Pulmonary:     Effort: Pulmonary effort is normal. No respiratory distress.     Breath sounds: Normal breath sounds. No wheezing.  Skin:    Capillary Refill: Capillary refill takes less than 2 seconds.  Neurological:     General: No focal deficit present.     Mental Status: She is alert and oriented to person, place, and time.     Cranial Nerves: No cranial nerve deficit.     Motor: No weakness.  Psychiatric:        Mood and Affect: Mood normal.        Behavior: Behavior normal.        Thought Content: Thought content normal.        Judgment: Judgment normal.        Assessment And Plan:     1. Type 2 diabetes mellitus with hyperglycemia, with long-term current use of insulin (HCC) Comments: HgbA1c at last visit was improving Continues with [place] and Rybelsus and tolerating well She is encouraged to go to the opthalmology - Hemoglobin A1c  2. Mixed hyperlipidemia Comments: Stable, continue current medications - CMP14+EGFR - Lipid panel  3. Mood changes Comments: She has a family history of Bipolar and Schizophrenia. She also is aware of her changes in eating habits vs career vs spiritual choices - Ambulatory referral to Psychiatry  4. Class 2 severe obesity due to excess calories with serious comorbidity and body mass index (BMI) of 39.0 to 39.9 in adult [facility]) Chronic Discussed healthy diet and regular exercise options  Encouraged to exercise at least 150 minutes per week with 2 days of strength training She  is encouraged to strive for BMI less than 30 to decrease cardiac risk.   5. Influenza vaccination declined Patient declined influenza vaccination at this time. Patient is aware that influenza vaccine prevents illness in 70% of healthy people, and reduces hospitalizations to 30-70% in elderly. This vaccine is recommended annually. Pt is willing to accept risk associated with refusing vaccination.  6. Family history of bipolar disorder - Ambulatory referral to Psychiatry  7. Family history of schizophrenia - Ambulatory referral to Psychiatry     Patient was given opportunity to ask questions. Patient verbalized understanding of the plan and was able to repeat key elements of the plan. All questions were answered to their satisfaction.  [name], FNP   I, [name], FNP, have reviewed all documentation for this visit. The documentation on  01/11/21 for the exam, diagnosis, procedures, and orders are all accurate and complete.   IF YOU HAVE BEEN REFERRED TO A SPECIALIST, IT MAY TAKE 1-2 WEEKS TO SCHEDULE/PROCESS THE REFERRAL. IF YOU HAVE NOT HEARD FROM US/SPECIALIST IN TWO WEEKS, PLEASE GIVE [place] A CALL AT [phone].   THE PATIENT IS ENCOURAGED TO PRACTICE SOCIAL DISTANCING DUE TO THE COVID-19 PANDEMIC.

## 2021-01-29 ENCOUNTER — Other Ambulatory Visit: Payer: Self-pay | Admitting: Nurse Practitioner

## 2021-01-29 DIAGNOSIS — E119 Type 2 diabetes mellitus without complications: Secondary | ICD-10-CM

## 2021-02-05 ENCOUNTER — Ambulatory Visit: Payer: BC Managed Care – PPO | Admitting: Podiatry

## 2021-02-10 ENCOUNTER — Other Ambulatory Visit: Payer: Self-pay

## 2021-02-10 ENCOUNTER — Encounter: Payer: Self-pay | Admitting: Podiatry

## 2021-02-10 ENCOUNTER — Ambulatory Visit: Payer: BC Managed Care – PPO | Admitting: Podiatry

## 2021-02-10 ENCOUNTER — Ambulatory Visit (INDEPENDENT_AMBULATORY_CARE_PROVIDER_SITE_OTHER): Payer: BC Managed Care – PPO

## 2021-02-10 DIAGNOSIS — M7671 Peroneal tendinitis, right leg: Secondary | ICD-10-CM | POA: Diagnosis not present

## 2021-02-10 DIAGNOSIS — M779 Enthesopathy, unspecified: Secondary | ICD-10-CM

## 2021-02-10 MED ORDER — TRIAMCINOLONE ACETONIDE 10 MG/ML IJ SUSP
10.0000 mg | Freq: Once | INTRAMUSCULAR | Status: AC
  Administered 2021-02-10: 10 mg

## 2021-02-10 NOTE — Progress Notes (Signed)
Subjective:   Patient ID: Diane Chaney, female   DOB: 45 y.o.   MRN: [id number]   HPI Patient presents stating that she has a lot of pain in the outside of her right foot for around a month and does have flatfeet and had inserts made by chiropractor over the summer that are not satisfactory for her.  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  Patient does not smoke and would like to be more active and has A1c of around 8 and knows she needs to be more active to get it under better control admitting she does not take good care of   Review of Systems  All other systems reviewed and are negative.      Objective:  Physical Exam Vitals and nursing note reviewed.  Constitutional:      Appearance: She is well-developed.  Pulmonary:     Effort: Pulmonary effort is normal.  Musculoskeletal:        General: Normal range of motion.  Skin:    General: Skin is warm.  Neurological:     Mental Status: She is alert.    Neurovascular status intact muscle strength found to be adequate range of motion within normal limits.  Patient is found to have inflammation pain of the outside of the right foot with with fluid buildup around the peroneal tendon as it inserts into base of fifth metatarsal with no indications of tendon dysfunction.  Also has significant flatfoot deformity bilateral with no indications of coalition or arthritis.     Assessment:  Acute peroneal tendinitis right with inflammation with flatfoot deformity is secondary problem H     Plan:  PA reviewed condition and at this point reviewed x-ray.  Did discuss rupture associated with injection and she wants injection I did sterile prep outside right foot did careful sheath injection 3 mg Dexasone Kenalog 5 mg Xylocaine and discussed that if this were to persist may have to consider immobilization or possible MRI if  symptoms continue.  Discussed possible new orthotics and I will evaluate her other orthotics at next visit  X-rays indicate significant flatfoot deformity no indications of pathology around the base of the fifth metatarsal

## 2021-02-25 ENCOUNTER — Ambulatory Visit: Payer: BC Managed Care – PPO | Admitting: Podiatry

## 2021-02-25 ENCOUNTER — Other Ambulatory Visit: Payer: Self-pay

## 2021-02-25 ENCOUNTER — Encounter: Payer: Self-pay | Admitting: Podiatry

## 2021-02-25 DIAGNOSIS — M7671 Peroneal tendinitis, right leg: Secondary | ICD-10-CM

## 2021-02-25 MED ORDER — TRIAMCINOLONE ACETONIDE 10 MG/ML IJ SUSP
10.0000 mg | Freq: Once | INTRAMUSCULAR | Status: AC
  Administered 2021-02-25: 10 mg

## 2021-02-25 MED ORDER — DICLOFENAC SODIUM 75 MG PO TBEC: 75.0000 mg | DELAYED_RELEASE_TABLET | Freq: Two times a day (BID) | ORAL | 2 refills | Status: DC

## 2021-03-24 LAB — HM DIABETES EYE EXAM

## 2021-03-25 ENCOUNTER — Encounter: Payer: Self-pay | Admitting: Nurse Practitioner

## 2021-03-27 ENCOUNTER — Other Ambulatory Visit: Payer: Self-pay | Admitting: Nurse Practitioner

## 2021-03-28 ENCOUNTER — Other Ambulatory Visit: Payer: Self-pay | Admitting: Nurse Practitioner

## 2021-03-28 DIAGNOSIS — E1165 Type 2 diabetes mellitus with hyperglycemia: Secondary | ICD-10-CM

## 2021-05-06 ENCOUNTER — Other Ambulatory Visit: Payer: Self-pay | Admitting: Nurse Practitioner

## 2021-05-06 ENCOUNTER — Ambulatory Visit (INDEPENDENT_AMBULATORY_CARE_PROVIDER_SITE_OTHER): Payer: BC Managed Care – PPO | Admitting: Nurse Practitioner

## 2021-05-06 ENCOUNTER — Encounter: Payer: Self-pay | Admitting: Nurse Practitioner

## 2021-05-06 ENCOUNTER — Other Ambulatory Visit: Payer: Self-pay

## 2021-05-06 VITALS — BP 130/70 | HR 98 | Temp 98.2°F | Ht 63.6 in | Wt 227.0 lb

## 2021-05-06 DIAGNOSIS — M79671 Pain in right foot: Secondary | ICD-10-CM | POA: Diagnosis not present

## 2021-05-06 DIAGNOSIS — E782 Mixed hyperlipidemia: Secondary | ICD-10-CM | POA: Diagnosis not present

## 2021-05-06 DIAGNOSIS — Z794 Long term (current) use of insulin: Secondary | ICD-10-CM | POA: Diagnosis not present

## 2021-05-06 DIAGNOSIS — Z79899 Other long term (current) drug therapy: Secondary | ICD-10-CM

## 2021-05-06 DIAGNOSIS — Z0000 Encounter for general adult medical examination without abnormal findings: Secondary | ICD-10-CM | POA: Diagnosis not present

## 2021-05-06 DIAGNOSIS — E1169 Type 2 diabetes mellitus with other specified complication: Secondary | ICD-10-CM | POA: Diagnosis not present

## 2021-05-06 DIAGNOSIS — Z6839 Body mass index (BMI) 39.0-39.9, adult: Secondary | ICD-10-CM

## 2021-05-06 LAB — POCT URINALYSIS DIPSTICK
Bilirubin, UA: NEGATIVE
Blood, UA: NEGATIVE
Glucose, UA: NEGATIVE
Ketones, UA: NEGATIVE
Leukocytes, UA: NEGATIVE
Nitrite, UA: NEGATIVE
Protein, UA: NEGATIVE
Spec Grav, UA: 1.02 (ref 1.010–1.025)
Urobilinogen, UA: 0.2 E.U./dL
pH, UA: 5.5 (ref 5.0–8.0)

## 2021-05-06 MED ORDER — TRESIBA FLEXTOUCH 100 UNIT/ML ~~LOC~~ SOPN: 20.0000 [IU] | PEN_INJECTOR | Freq: Every day | SUBCUTANEOUS | 3 refills | Status: DC

## 2021-05-06 MED ORDER — RYBELSUS 14 MG PO TABS: ORAL_TABLET | ORAL | 1 refills | Status: DC

## 2021-05-06 NOTE — Patient Instructions (Signed)

## 2021-05-06 NOTE — Progress Notes (Signed)
I,Tianna Badgett,acting as a Education administrator for [employer], FNP.,have documented all relevant documentation on the behalf of [name], FNP,as directed by  [name], FNP while in the presence of [name], [place].  This visit occurred during the SARS-CoV-2 public health emergency. Safety protocols were in place, including screening questions prior to the visit, additional usage of staff PPE, and extensive cleaning of exam room while observing appropriate contact time as indicated for disinfecting solutions.  Subjective:     Patient ID: Diane Chaney , female    DOB: [birth year] , 46 y.o.   MRN: [id number]   Chief Complaint  Patient presents with   Annual Exam    HPI  Here for HM. She has seen the NP at Dr. Marquis Buggy office, she only went once but plans to go back to get a better understanding of her "diagnosis". She did go to the podiatrist, she has been paying $80 each visit due to them not being in network. She had 2 injections and was given a boot.   Wt Readings from Last 3 Encounters: 05/06/21 : 227 lb (103 kg) 01/11/21 : 226 lb 12.8 oz (102.9 kg) 08/06/20 : 224 lb 9.6 oz (101.9 kg)    Diabetes She presents for her follow-up diabetic visit. She has type 2 diabetes mellitus. There are no hypoglycemic associated symptoms. Pertinent negatives for hypoglycemia include no confusion, dizziness or headaches. There are no diabetic associated symptoms. Pertinent negatives for diabetes include no chest pain. Symptoms are worsening. Risk factors for coronary artery disease include diabetes mellitus, obesity and sedentary lifestyle. Current diabetic treatment includes oral agent (dual therapy). She is compliant with treatment most of the time. (Reports blood sugars have been in range)    Past Medical History:  Diagnosis Date   Hyperglycemia      No family history on file.   Current Outpatient Medications:    acetaminophen (TYLENOL) 500 MG tablet, Take 1,000 mg by mouth every 6 (six)  hours as needed for moderate pain., Disp: , Rfl:    atorvastatin (LIPITOR) 20 MG tablet, TAKE 1 TABLET BY MOUTH EVERY DAY, Disp: 90 tablet, Rfl: 1   BD PEN NEEDLE MICRO U/F 32G X 6 MM MISC, USE AS DIRECTED DAILY, Disp: 100 each, Rfl: 3   blood glucose meter kit and supplies KIT, Dispense based on patient and insurance preference. Use up to four times daily as directed. (FOR ICD-9 250.00, 250.01)., Disp: 1 each, Rfl: 0   diclofenac (VOLTAREN) 75 MG EC tablet, Take 1 tablet (75 mg total) by mouth 2 (two) times daily., Disp: 50 tablet, Rfl: 2   insulin degludec (TRESIBA FLEXTOUCH) 100 UNIT/ML FlexTouch Pen, Inject 20 Units into the skin daily., Disp: 15 mL, Rfl: 3   losartan (COZAAR) 25 MG tablet, TAKE 1 TABLET BY MOUTH EVERY DAY, Disp: 90 tablet, Rfl: 0   Multiple Vitamin (MULTIVITAMIN WITH MINERALS) TABS tablet, Take 1 tablet by mouth daily., Disp: , Rfl:    Semaglutide (RYBELSUS) 14 MG TABS, Take 1 tablet by mouth 30 minutes after breakfast daily, Disp: 90 tablet, Rfl: 1   No Known Allergies    The patient states she is status post hysterectomy.  Patient's last menstrual period was 06/22/2010.. Negative for Dysmenorrhea and Negative for Menorrhagia. Negative for: breast discharge, breast lump(s), breast pain and breast self exam. Associated symptoms include abnormal vaginal bleeding. Pertinent negatives include abnormal bleeding (hematology), anxiety, decreased libido, depression, difficulty falling sleep, dyspareunia, history of infertility, nocturia, sexual dysfunction, sleep disturbances, urinary incontinence,  urinary urgency, vaginal discharge and vaginal itching. Diet she is ordering nutrisystem at least 90% of the time. The patient states her exercise level is minimal.   The patient's tobacco use is:  Social History   Tobacco Use  Smoking Status Never  Smokeless Tobacco Never   She has been exposed to passive smoke. The patient's alcohol use is:  Social History   Substance and Sexual  Activity  Alcohol Use Never     Review of Systems  Constitutional: Negative.   HENT: Negative.    Eyes: Negative.   Respiratory: Negative.    Cardiovascular: Negative.  Negative for chest pain, palpitations and leg swelling.  Gastrointestinal: Negative.   Endocrine: Negative.   Genitourinary: Negative.   Musculoskeletal: Negative.   Skin: Negative.   Allergic/Immunologic: Negative.   Neurological: Negative.  Negative for dizziness and headaches.  Hematological: Negative.   Psychiatric/Behavioral: Negative.  Negative for confusion.     Today's Vitals   05/06/21 1546  BP: 130/70  Pulse: 98  Temp: 98.2 F (36.8 C)  TempSrc: Oral  Weight: 227 lb (103 kg)  Height: 5' 3.6" (1.615 m)   Body mass index is 39.46 kg/m.  Wt Readings from Last 3 Encounters:  05/06/21 227 lb (103 kg)  01/11/21 226 lb 12.8 oz (102.9 kg)  08/06/20 224 lb 9.6 oz (101.9 kg)    Objective:  Physical Exam Constitutional:      General: She is not in acute distress.    Appearance: Normal appearance. She is well-developed. She is obese.  HENT:     Head: Normocephalic and atraumatic.     Right Ear: Hearing, tympanic membrane, ear canal and external ear normal. There is no impacted cerumen.     Left Ear: Hearing, tympanic membrane, ear canal and external ear normal. There is no impacted cerumen.     Nose:     Comments: Deferred - masked    Mouth/Throat:     Comments: Deferred - masked Eyes:     General: Lids are normal.     Extraocular Movements: Extraocular movements intact.     Conjunctiva/sclera: Conjunctivae normal.     Pupils: Pupils are equal, round, and reactive to light.     Funduscopic exam:    Right eye: No papilledema.        Left eye: No papilledema.  Neck:     Thyroid: No thyroid mass.     Vascular: No carotid bruit.  Cardiovascular:     Rate and Rhythm: Normal rate and regular rhythm.     Pulses: Normal pulses.     Heart sounds: Normal heart sounds. No murmur heard. Pulmonary:      Effort: Pulmonary effort is normal. No respiratory distress.     Breath sounds: Normal breath sounds. No wheezing.  Chest:     Chest wall: No mass.  Breasts:    Tanner Score is 5.     Right: Normal. No mass or tenderness.     Left: Normal. No mass or tenderness.  Abdominal:     General: Abdomen is flat. Bowel sounds are normal. There is no distension.     Palpations: Abdomen is soft.     Tenderness: There is no abdominal tenderness.  Genitourinary:    Rectum: Guaiac result negative.  Musculoskeletal:        General: Swelling (non pitting swelling) and tenderness (right dorsal surface of right foot) present. Normal range of motion.     Cervical back: Full passive range of motion without pain,  normal range of motion and neck supple.     Right lower leg: No edema.     Left lower leg: No edema.  Lymphadenopathy:     Upper Body:     Right upper body: No supraclavicular, axillary or pectoral adenopathy.     Left upper body: No supraclavicular, axillary or pectoral adenopathy.  Skin:    General: Skin is warm and dry.     Capillary Refill: Capillary refill takes less than 2 seconds.     Comments: She does have a hyperpigmented area to right anterior dorsal surface of right foot.  Neurological:     General: No focal deficit present.     Mental Status: She is alert and oriented to person, place, and time.     Cranial Nerves: No cranial nerve deficit.     Sensory: No sensory deficit.     Motor: No weakness.  Psychiatric:        Mood and Affect: Mood normal.        Behavior: Behavior normal.        Thought Content: Thought content normal.        Judgment: Judgment normal.        Assessment And Plan:     1. Encounter for general adult medical examination w/o abnormal findings Behavior modifications discussed and diet history reviewed.   Pt will continue to exercise regularly and modify diet with low GI, plant based foods and decrease intake of processed foods.  Recommend intake  of daily multivitamin, Vitamin D, and calcium.  Recommend mammogram (up to date) for preventive screenings, as well as recommend immunizations that include influenza, TDAP. Discussed the importance of monthly self breast exams  2. Type 2 diabetes mellitus with other specified complication, with long-term current use of insulin (HCC) Comments: HgbA1c at last visit was elevated, will recheck pending results may need to add another medication. Discussed trying to get her off insulin at some point as this can increase your risk for weight gain. EKG done with NSR - POCT Urinalysis Dipstick (81002) - Microalbumin / Creatinine Urine Ratio - EKG 12-Lead - Hemoglobin A1c - Semaglutide (RYBELSUS) 14 MG TABS; Take 1 tablet by mouth 30 minutes after breakfast daily  Dispense: 90 tablet; Refill: 1 - insulin degludec (TRESIBA FLEXTOUCH) 100 UNIT/ML FlexTouch Pen; Inject 20 Units into the skin daily.  Dispense: 15 mL; Refill: 3  3. Mixed hyperlipidemia Comments: Stable, tolerating statin well, continue current medications - CMP14+EGFR - Lipid panel  4. Class 2 severe obesity due to excess calories with serious comorbidity and body mass index (BMI) of 39.0 to 39.9 in adult [facility]) Chronic Discussed healthy diet and regular exercise options  Encouraged to exercise at least 150 minutes per week with 2 days of strength training. I have encouraged her to get back to being more active.  She is encouraged to strive for BMI less than 30 to decrease cardiac risk.  5. Other long term (current) drug therapy - CBC  6. Right foot pain Comments: She has been to [facility] and had 2 injections with a boot, some better but till has pain and hyperpigmented area to dorsal surface with swelling      Patient was given opportunity to ask questions. Patient verbalized understanding of the plan and was able to repeat key elements of the plan. All questions were answered to their satisfaction.   [name], FNP   I,  [name], FNP, have reviewed all documentation for this visit. The documentation  on 05/06/21 for the exam, diagnosis, procedures, and orders are all accurate and complete.  THE PATIENT IS ENCOURAGED TO PRACTICE SOCIAL DISTANCING DUE TO THE COVID-19 PANDEMIC.

## 2021-05-07 LAB — CMP14+EGFR
ALT: 12 IU/L (ref 0–32)
AST: 17 IU/L (ref 0–40)
Albumin/Globulin Ratio: 1.4 (ref 1.2–2.2)
Albumin: 4.4 g/dL (ref 3.8–4.8)
Alkaline Phosphatase: 86 IU/L (ref 44–121)
BUN/Creatinine Ratio: 15 (ref 9–23)
BUN: 10 mg/dL (ref 6–24)
Bilirubin Total: 0.3 mg/dL (ref 0.0–1.2)
CO2: 22 mmol/L (ref 20–29)
Calcium: 9.3 mg/dL (ref 8.7–10.2)
Chloride: 104 mmol/L (ref 96–106)
Creatinine, Ser: 0.68 mg/dL (ref 0.57–1.00)
Globulin, Total: 3.1 g/dL (ref 1.5–4.5)
Glucose: 83 mg/dL (ref 70–99)
Potassium: 4.5 mmol/L (ref 3.5–5.2)
Sodium: 141 mmol/L (ref 134–144)
Total Protein: 7.5 g/dL (ref 6.0–8.5)
eGFR: 110 mL/min/{1.73_m2} (ref >59)

## 2021-05-07 LAB — LIPID PANEL
Chol/HDL Ratio: 3.7 ratio (ref 0.0–4.4)
Cholesterol, Total: 174 mg/dL (ref 100–199)
HDL: 47 mg/dL (ref >39)
LDL Chol Calc (NIH): 111 mg/dL — ABNORMAL HIGH (ref 0–99)
Triglycerides: 85 mg/dL (ref 0–149)
VLDL Cholesterol Cal: 16 mg/dL (ref 5–40)

## 2021-05-07 LAB — CBC
Hematocrit: 36.2 % (ref 34.0–46.6)
Hemoglobin: 11.5 g/dL (ref 11.1–15.9)
MCH: 23 pg — ABNORMAL LOW (ref 26.6–33.0)
MCHC: 31.8 g/dL (ref 31.5–35.7)
MCV: 72 fL — ABNORMAL LOW (ref 79–97)
Platelets: 330 10*3/uL (ref 150–450)
RBC: 5.01 x10E6/uL (ref 3.77–5.28)
RDW: 14.7 % (ref 11.7–15.4)
WBC: 7.9 10*3/uL (ref 3.4–10.8)

## 2021-05-07 LAB — MICROALBUMIN / CREATININE URINE RATIO
Creatinine, Urine: 86.1 mg/dL
Microalb/Creat Ratio: <3 mg/g creat (ref 0–29)
Microalbumin, Urine: <3 ug/mL

## 2021-05-07 LAB — HEMOGLOBIN A1C
Est. average glucose Bld gHb Est-mCnc: 169 mg/dL
Hgb A1c MFr Bld: 7.5 % — ABNORMAL HIGH (ref 4.8–5.6)

## 2021-06-28 ENCOUNTER — Other Ambulatory Visit: Payer: Self-pay | Admitting: Nurse Practitioner

## 2021-06-28 DIAGNOSIS — E1165 Type 2 diabetes mellitus with hyperglycemia: Secondary | ICD-10-CM

## 2021-08-03 IMAGING — MG MM DIGITAL SCREENING BILAT W/ TOMO AND CAD
8 series · 8 of 24 positions shown · non-contrast
Comparison: Previous exam(s).

ACR Breast Density Category a: The breast tissue is almost entirely
fatty.

CLINICAL DATA: Screening.

EXAM:
DIGITAL SCREENING BILATERAL MAMMOGRAM WITH TOMOSYNTHESIS AND CAD
TECHNIQUE: Bilateral screening digital craniocaudal and mediolateral oblique
mammograms were obtained. Bilateral screening digital breast
tomosynthesis was performed. The images were evaluated with
computer-aided detection.

[L MLO synth-2D]
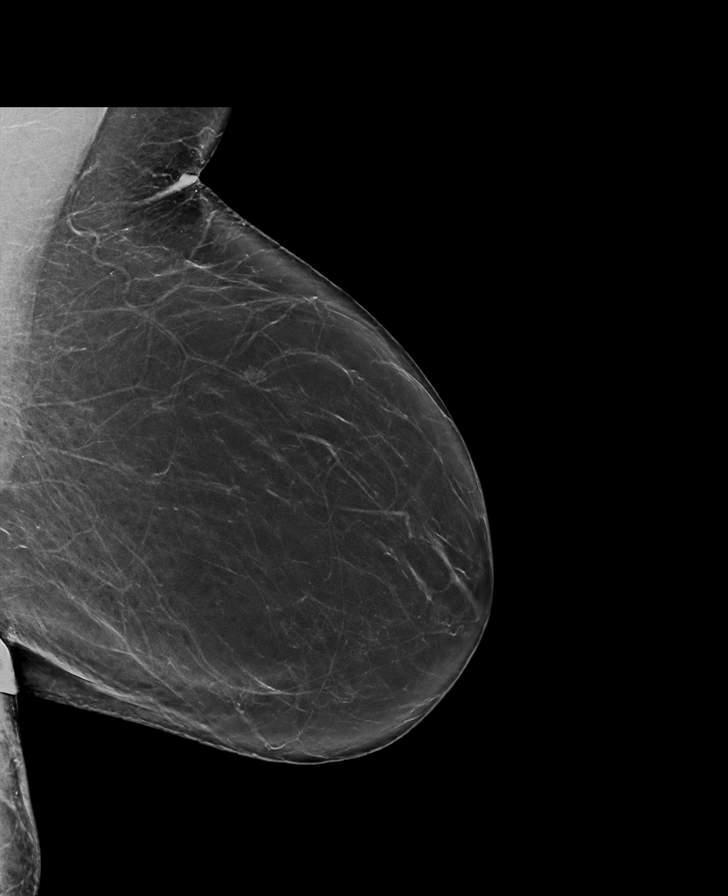

[L CC synth-2D]
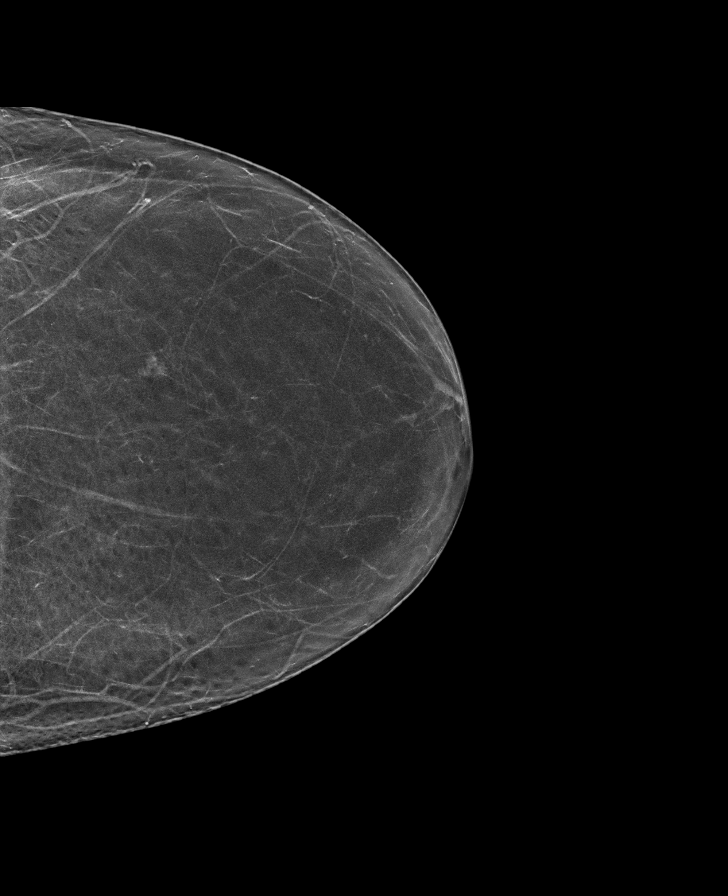

[R MLO synth-2D]
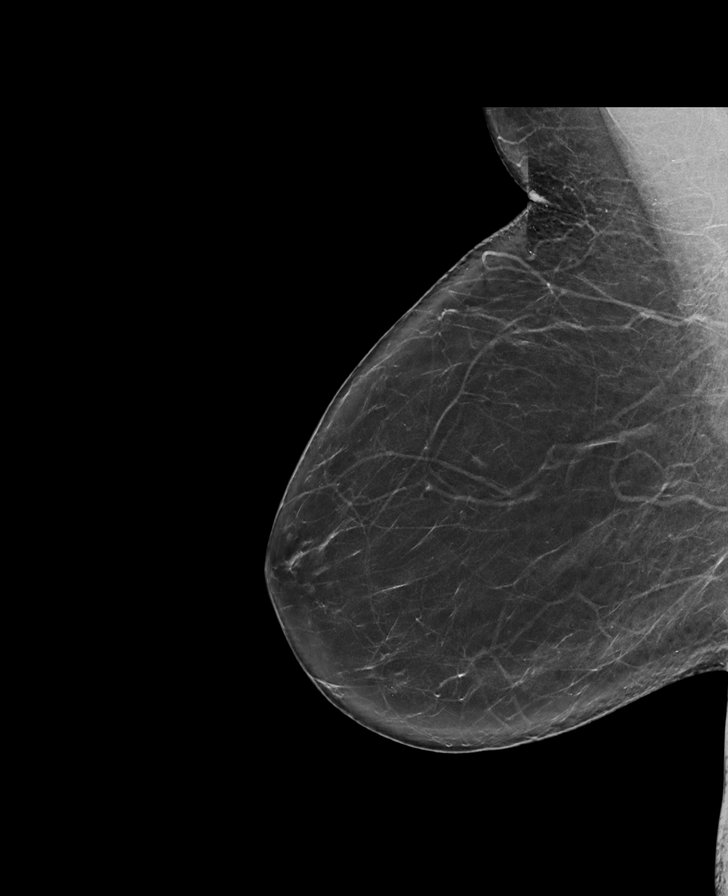

[R CC synth-2D]
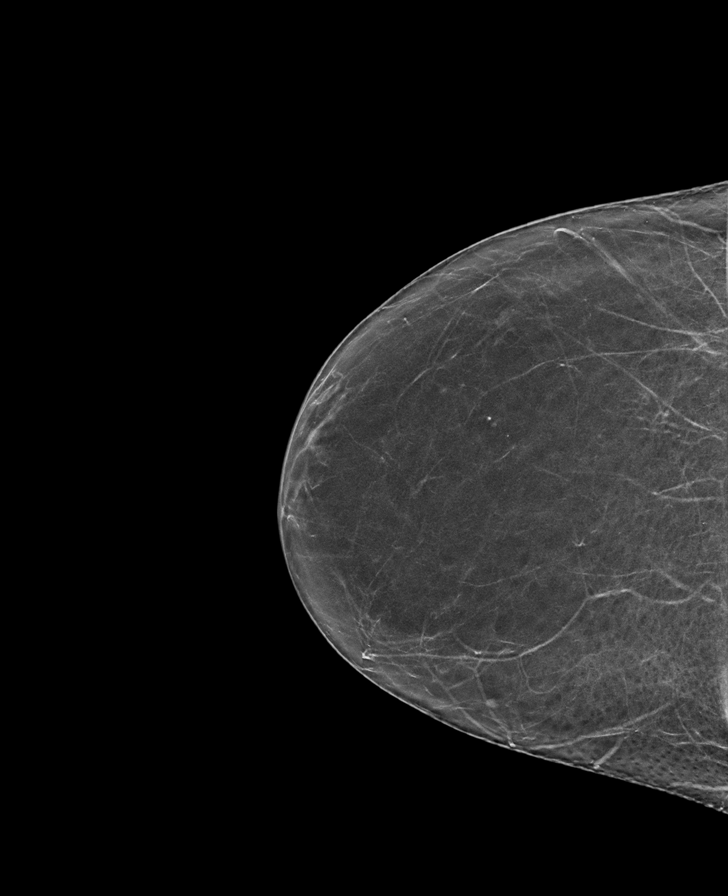

[R CC tomo · tomo slice 31/60.0]
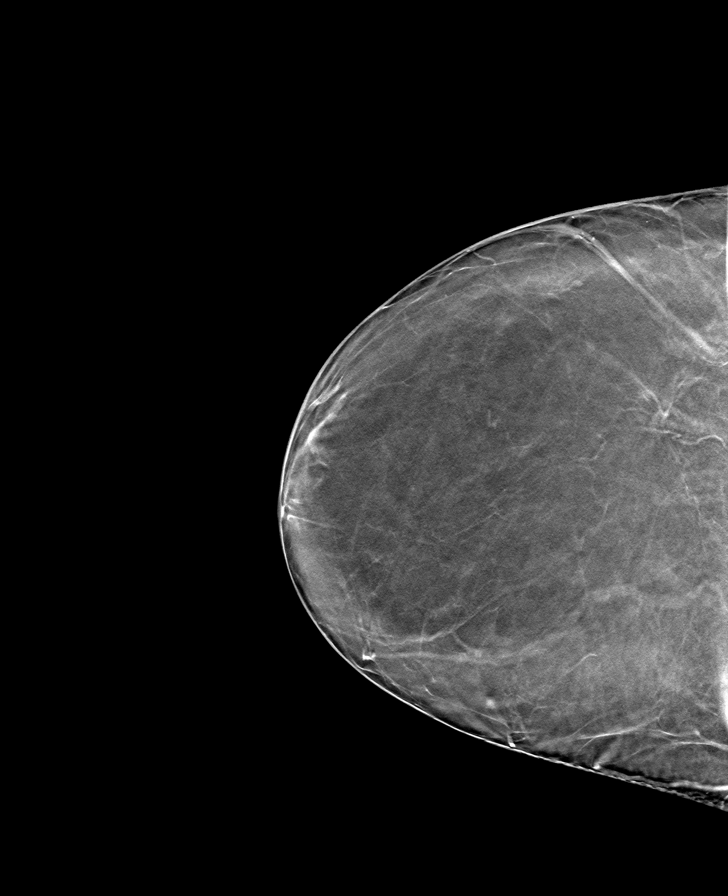

[R MLO tomo · tomo slice 39/77.0]
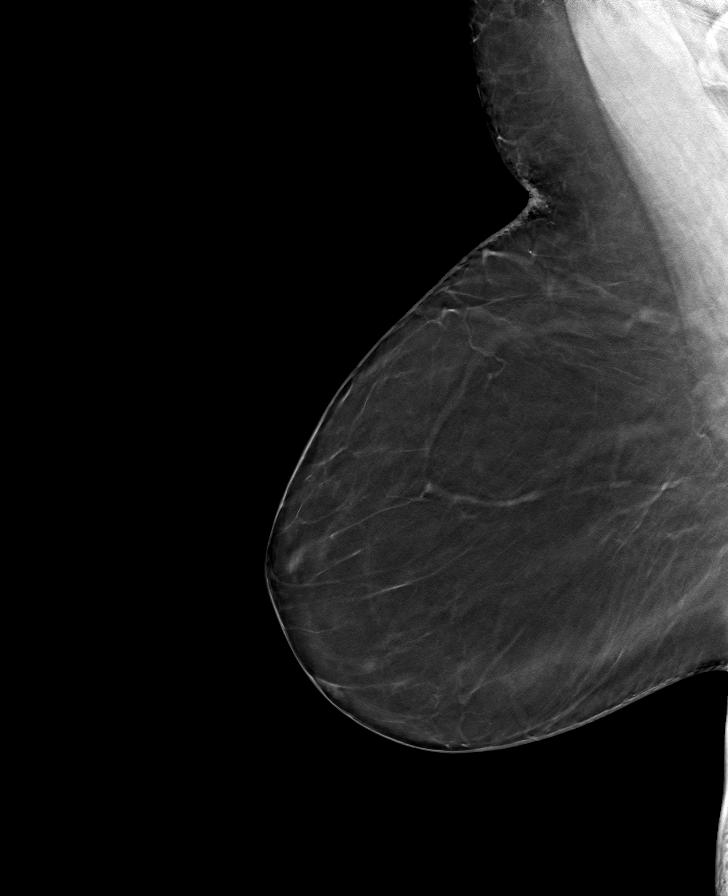

[L CC tomo · tomo slice 33/65.0]
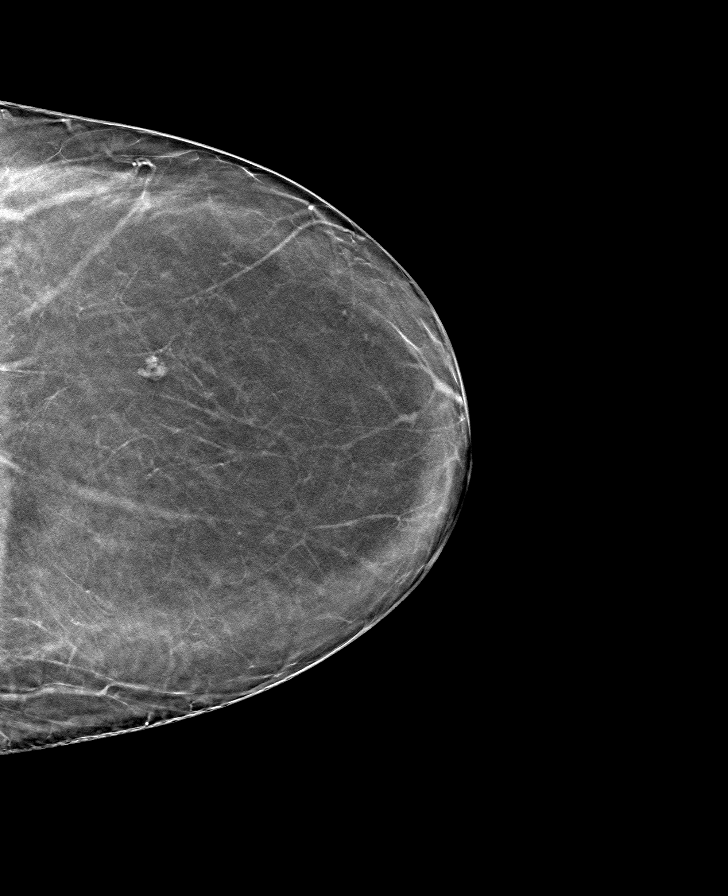

[L MLO tomo · tomo slice 39/76.0]
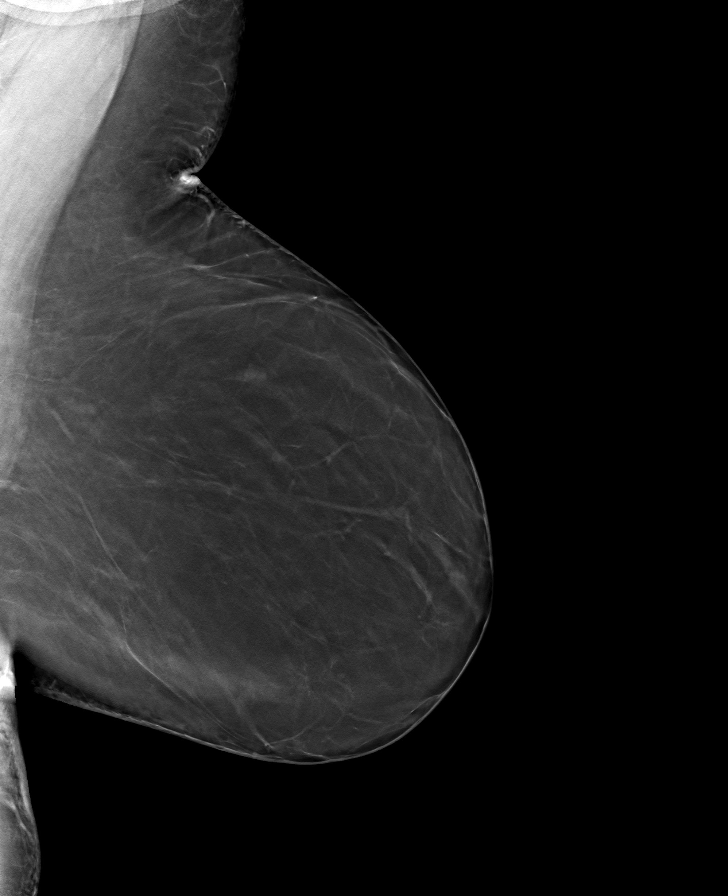

[8 of 24 positions shown; findings below may reference images not displayed]

FINDINGS: There are no findings suspicious for malignancy. The images were
evaluated with computer-aided detection.
IMPRESSION: No mammographic evidence of malignancy. A result letter of this
screening mammogram will be mailed directly to the patient.

RECOMMENDATION:
Screening mammogram in one year. (Code:JP-J-DD5)

BI-RADS CATEGORY  1: Negative.

## 2021-08-04 ENCOUNTER — Ambulatory Visit: Payer: BC Managed Care – PPO | Admitting: Nurse Practitioner

## 2021-09-20 ENCOUNTER — Other Ambulatory Visit: Payer: Self-pay | Admitting: Nurse Practitioner

## 2021-09-20 DIAGNOSIS — E1165 Type 2 diabetes mellitus with hyperglycemia: Secondary | ICD-10-CM

## 2021-10-12 ENCOUNTER — Encounter: Payer: Self-pay | Admitting: Nurse Practitioner

## 2021-10-12 ENCOUNTER — Ambulatory Visit: Payer: BC Managed Care – PPO | Admitting: Nurse Practitioner

## 2021-10-12 VITALS — BP 108/80 | HR 97 | Temp 98.1°F | Ht 68.0 in | Wt 225.8 lb

## 2021-10-12 DIAGNOSIS — E782 Mixed hyperlipidemia: Secondary | ICD-10-CM | POA: Diagnosis not present

## 2021-10-12 DIAGNOSIS — E1169 Type 2 diabetes mellitus with other specified complication: Secondary | ICD-10-CM

## 2021-10-12 DIAGNOSIS — Z79899 Other long term (current) drug therapy: Secondary | ICD-10-CM

## 2021-10-12 DIAGNOSIS — E6609 Other obesity due to excess calories: Secondary | ICD-10-CM | POA: Diagnosis not present

## 2021-10-12 DIAGNOSIS — Z6834 Body mass index (BMI) 34.0-34.9, adult: Secondary | ICD-10-CM

## 2021-10-12 DIAGNOSIS — Z1211 Encounter for screening for malignant neoplasm of colon: Secondary | ICD-10-CM

## 2021-10-12 DIAGNOSIS — Z794 Long term (current) use of insulin: Secondary | ICD-10-CM

## 2021-10-12 MED ORDER — TRESIBA FLEXTOUCH 100 UNIT/ML ~~LOC~~ SOPN: 20.0000 [IU] | PEN_INJECTOR | Freq: Every day | SUBCUTANEOUS | 3 refills | Status: DC

## 2021-10-12 MED ORDER — RYBELSUS 14 MG PO TABS: ORAL_TABLET | ORAL | 1 refills | Status: DC

## 2021-10-12 NOTE — Patient Instructions (Signed)

## 2021-10-12 NOTE — Progress Notes (Signed)
I,Diane Chaney,acting as a Education administrator for Diane Brine, FNP.,have documented all relevant documentation on the behalf of Diane Brine, FNP,as directed by  Diane Brine, FNP while in the presence of Diane Chaney, [place].    Subjective:     Patient ID: Diane Chaney , female    DOB: Jul 04, 1975 , 46 y.o.   MRN: [id number]   Chief Complaint  Patient presents with   Diabetes    HPI  Patient here today for DM f/u. She reports compliance with meds. She admits to not taking her blood sugars regularly. She is more active since being in school.   Patient notified HM needs Wt Readings from Last 3 Encounters: 10/12/21 : 225 lb 12.8 oz (102.4 kg) 05/06/21 : 227 lb (103 kg) 01/11/21 : 226 lb 12.8 oz (102.9 kg)    Diabetes She presents for her follow-up diabetic visit. She has type 2 diabetes mellitus. There are no hypoglycemic associated symptoms. Pertinent negatives for hypoglycemia include no confusion, dizziness or headaches. There are no diabetic associated symptoms. Pertinent negatives for diabetes include no chest pain, no polydipsia, no polyphagia and no polyuria. There are no hypoglycemic complications. Symptoms are worsening. There are no diabetic complications. Risk factors for coronary artery disease include sedentary lifestyle, obesity and diabetes mellitus. Current diabetic treatment includes oral agent (dual therapy). She has not had a previous visit with a dietitian. (She is not checking her blood sugar ) An ACE inhibitor/angiotensin II receptor blocker is being taken. She does not see a podiatrist.Eye exam is not current.     Past Medical History:  Diagnosis Date   Hyperglycemia      History reviewed. No pertinent family history.   Current Outpatient Medications:    acetaminophen (TYLENOL) 500 MG tablet, Take 1,000 mg by mouth every 6 (six) hours as needed for moderate pain., Disp: , Rfl:    atorvastatin (LIPITOR) 20 MG tablet, TAKE 1 TABLET BY MOUTH EVERY DAY, Disp: 90  tablet, Rfl: 1   losartan (COZAAR) 25 MG tablet, TAKE 1 TABLET BY MOUTH EVERY DAY, Disp: 90 tablet, Rfl: 0   Multiple Vitamin (MULTIVITAMIN WITH MINERALS) TABS tablet, Take 1 tablet by mouth daily., Disp: , Rfl:    BD PEN NEEDLE MICRO U/F 32G X 6 MM MISC, USE AS DIRECTED DAILY (Patient not taking: Reported on 10/12/2021), Disp: 100 each, Rfl: 3   blood glucose meter kit and supplies KIT, Dispense based on patient and insurance preference. Use up to four times daily as directed. (FOR ICD-9 250.00, 250.01). (Patient not taking: Reported on 10/12/2021), Disp: 1 each, Rfl: 0   diclofenac (VOLTAREN) 75 MG EC tablet, Take 1 tablet (75 mg total) by mouth 2 (two) times daily. (Patient not taking: Reported on 10/12/2021), Disp: 50 tablet, Rfl: 2   insulin degludec (TRESIBA FLEXTOUCH) 100 UNIT/ML FlexTouch Pen, Inject 20 Units into the skin daily., Disp: 15 mL, Rfl: 3   Semaglutide (RYBELSUS) 14 MG TABS, Take 1 tablet by mouth 30 minutes after breakfast daily, Disp: 90 tablet, Rfl: 1   No Known Allergies   Review of Systems  Cardiovascular:  Negative for chest pain.  Endocrine: Negative for polydipsia, polyphagia and polyuria.  Neurological:  Negative for dizziness and headaches.  Psychiatric/Behavioral:  Negative for confusion.      Today's Vitals   10/12/21 1626  BP: 108/80  Pulse: 97  Temp: 98.1 F (36.7 C)  SpO2: 98%  Weight: 225 lb 12.8 oz (102.4 kg)  Height: 5' 8" (1.727 m)  PainSc:  0-No pain   Body mass index is 34.33 kg/m.  Wt Readings from Last 3 Encounters:  10/12/21 225 lb 12.8 oz (102.4 kg)  05/06/21 227 lb (103 kg)  01/11/21 226 lb 12.8 oz (102.9 kg)    Objective:  Physical Exam Vitals reviewed.  Constitutional:      General: She is not in acute distress.    Appearance: Normal appearance. She is obese.  Cardiovascular:     Rate and Rhythm: Normal rate and regular rhythm.     Pulses: Normal pulses.     Heart sounds: Normal heart sounds. No murmur heard. Pulmonary:      Effort: Pulmonary effort is normal. No respiratory distress.     Breath sounds: Normal breath sounds. No wheezing.  Musculoskeletal:        General: Swelling (feet are "puffy" she does have on Birkenstocks) present.  Skin:    General: Skin is warm and dry.     Capillary Refill: Capillary refill takes less than 2 seconds.  Neurological:     General: No focal deficit present.     Mental Status: She is alert and oriented to person, place, and time.     Cranial Nerves: No cranial nerve deficit.     Motor: No weakness.  Psychiatric:        Mood and Affect: Mood normal.        Behavior: Behavior normal.        Thought Content: Thought content normal.        Judgment: Judgment normal.         Assessment And Plan:     1. Diabetes mellitus type 2 in obese [facility]) Comments: HgbA1c is improving, continue [place] and Rybels[place] will consider decreasing her insulin if continues to decline - CMP14+EGFR - Hemoglobin A1c - insulin degludec (TRESIBA FLEXTOUCH) 100 UNIT/ML FlexTouch Pen; Inject 20 Units into the skin daily.  Dispense: 15 mL; Refill: 3 - Semaglutide (RYBELSUS) 14 MG TABS; Take 1 tablet by mouth 30 minutes after breakfast daily  Dispense: 90 tablet; Refill: 1  2. Mixed hyperlipidemia Comments: Stable, continue statin, tolerating well.  - CMP14+EGFR - Lipid panel  3. Class 1 obesity due to excess calories without serious comorbidity with body mass index (BMI) of 34.0 to 34.9 in adult Comments: Encouraged to continue exercising regularly and eating healthy diet low in sugar and carbs. She is encouraged to strive for BMI less than 30 to decrease cardiac risk. Advised to aim for at least 150 minutes of exercise per week.   4. Other long term (current) drug therapy - CBC  5. Encounter for screening colonoscopy According to USPTF Colorectal cancer Screening guidelines. Colonoscopy is recommended every 10 years, starting at age 28 years. Will refer to GI for colon cancer screening. -  Ambulatory referral to Gastroenterology   Patient was given opportunity to ask questions. Patient verbalized understanding of the plan and was able to repeat key elements of the plan. All questions were answered to their satisfaction.  Diane Brine, FNP   I, Diane Brine, FNP, have reviewed all documentation for this visit. The documentation on 10/12/21 for the exam, diagnosis, procedures, and orders are all accurate and complete.   IF YOU HAVE BEEN REFERRED TO A SPECIALIST, IT MAY TAKE 1-2 WEEKS TO SCHEDULE/PROCESS THE REFERRAL. IF YOU HAVE NOT HEARD FROM US/SPECIALIST IN TWO WEEKS, PLEASE GIVE [place] A CALL AT [phone].   THE PATIENT IS ENCOURAGED TO PRACTICE SOCIAL DISTANCING DUE TO THE COVID-19 PANDEMIC.

## 2021-10-13 ENCOUNTER — Other Ambulatory Visit: Payer: Self-pay | Admitting: Nurse Practitioner

## 2021-10-13 LAB — CMP14+EGFR
ALT: 19 IU/L (ref 0–32)
AST: 16 IU/L (ref 0–40)
Albumin/Globulin Ratio: 1.4 (ref 1.2–2.2)
Albumin: 4.3 g/dL (ref 3.9–4.9)
Alkaline Phosphatase: 103 IU/L (ref 44–121)
BUN/Creatinine Ratio: 15 (ref 9–23)
BUN: 11 mg/dL (ref 6–24)
Bilirubin Total: 0.3 mg/dL (ref 0.0–1.2)
CO2: 23 mmol/L (ref 20–29)
Calcium: 9.3 mg/dL (ref 8.7–10.2)
Chloride: 103 mmol/L (ref 96–106)
Creatinine, Ser: 0.74 mg/dL (ref 0.57–1.00)
Globulin, Total: 3 g/dL (ref 1.5–4.5)
Glucose: 210 mg/dL — ABNORMAL HIGH (ref 70–99)
Potassium: 4.2 mmol/L (ref 3.5–5.2)
Sodium: 141 mmol/L (ref 134–144)
Total Protein: 7.3 g/dL (ref 6.0–8.5)
eGFR: 102 mL/min/{1.73_m2} (ref >59)

## 2021-10-13 LAB — LIPID PANEL
Chol/HDL Ratio: 3.4 ratio (ref 0.0–4.4)
Cholesterol, Total: 182 mg/dL (ref 100–199)
HDL: 53 mg/dL (ref >39)
LDL Chol Calc (NIH): 111 mg/dL — ABNORMAL HIGH (ref 0–99)
Triglycerides: 98 mg/dL (ref 0–149)
VLDL Cholesterol Cal: 18 mg/dL (ref 5–40)

## 2021-10-13 LAB — HEMOGLOBIN A1C
Est. average glucose Bld gHb Est-mCnc: 174 mg/dL
Hgb A1c MFr Bld: 7.7 % — ABNORMAL HIGH (ref 4.8–5.6)

## 2021-10-13 LAB — CBC
Hematocrit: 35.8 % (ref 34.0–46.6)
Hemoglobin: 11.7 g/dL (ref 11.1–15.9)
MCH: 23.1 pg — ABNORMAL LOW (ref 26.6–33.0)
MCHC: 32.7 g/dL (ref 31.5–35.7)
MCV: 71 fL — ABNORMAL LOW (ref 79–97)
Platelets: 311 10*3/uL (ref 150–450)
RBC: 5.07 x10E6/uL (ref 3.77–5.28)
RDW: 14.5 % (ref 11.7–15.4)
WBC: 8.1 10*3/uL (ref 3.4–10.8)

## 2021-10-13 MED ORDER — ATORVASTATIN CALCIUM 40 MG PO TABS: 40.0000 mg | ORAL_TABLET | Freq: Every day | ORAL | 1 refills | Status: DC

## 2021-10-19 ENCOUNTER — Other Ambulatory Visit: Payer: Self-pay | Admitting: Nurse Practitioner

## 2021-10-19 DIAGNOSIS — E1169 Type 2 diabetes mellitus with other specified complication: Secondary | ICD-10-CM

## 2021-10-21 ENCOUNTER — Other Ambulatory Visit: Payer: Self-pay | Admitting: Nurse Practitioner

## 2021-10-21 DIAGNOSIS — Z1231 Encounter for screening mammogram for malignant neoplasm of breast: Secondary | ICD-10-CM

## 2021-11-02 ENCOUNTER — Ambulatory Visit: Payer: BC Managed Care – PPO

## 2021-11-03 ENCOUNTER — Ambulatory Visit
Admission: RE | Admit: 2021-11-03 | Discharge: 2021-11-03 | Disposition: A | Discharge status: Home / Self Care | Payer: BC Managed Care – PPO | Source: Ambulatory Visit | Attending: Nurse Practitioner | Admitting: Nurse Practitioner

## 2021-11-03 DIAGNOSIS — Z1231 Encounter for screening mammogram for malignant neoplasm of breast: Secondary | ICD-10-CM

## 2021-12-08 LAB — HM COLONOSCOPY

## 2021-12-11 ENCOUNTER — Encounter: Payer: Self-pay | Admitting: Nurse Practitioner

## 2021-12-14 NOTE — Progress Notes (Signed)
Subjective:   Patient ID: Diane Chaney, female   DOB: 46 y.o.   MRN: [id number]   HPI Patient presents stating she still has pain in the outside of her right foot that is been sore and making it hard to walk   ROS      Objective:  Physical Exam  Neurovascular status intact with inflammation still present slightly different position than where it was first at the peroneal insertion right     Assessment:  Peroneal tendinitis right that was treated once and still having 1 area of discomfort not in the same area     Plan:  Reviewed risk and at this point went ahead today and after patient understands risk did do sterile prep and carefully injected more proximal 3 mg Dexasone Kenalog into the sheath.  Tolerated well reappoint as needed

## 2021-12-18 ENCOUNTER — Other Ambulatory Visit: Payer: Self-pay | Admitting: Nurse Practitioner

## 2021-12-18 DIAGNOSIS — E1165 Type 2 diabetes mellitus with hyperglycemia: Secondary | ICD-10-CM

## 2022-02-14 ENCOUNTER — Ambulatory Visit: Payer: BC Managed Care – PPO | Admitting: Nurse Practitioner

## 2022-02-14 ENCOUNTER — Encounter: Payer: Self-pay | Admitting: Nurse Practitioner

## 2022-02-14 VITALS — BP 130/68 | HR 98 | Temp 98.3°F | Ht 68.0 in | Wt 228.0 lb

## 2022-02-14 DIAGNOSIS — G8929 Other chronic pain: Secondary | ICD-10-CM

## 2022-02-14 DIAGNOSIS — Z2821 Immunization not carried out because of patient refusal: Secondary | ICD-10-CM

## 2022-02-14 DIAGNOSIS — E1169 Type 2 diabetes mellitus with other specified complication: Secondary | ICD-10-CM | POA: Diagnosis not present

## 2022-02-14 DIAGNOSIS — Z6834 Body mass index (BMI) 34.0-34.9, adult: Secondary | ICD-10-CM

## 2022-02-14 DIAGNOSIS — E782 Mixed hyperlipidemia: Secondary | ICD-10-CM | POA: Diagnosis not present

## 2022-02-14 DIAGNOSIS — E6609 Other obesity due to excess calories: Secondary | ICD-10-CM

## 2022-02-14 DIAGNOSIS — M79671 Pain in right foot: Secondary | ICD-10-CM | POA: Diagnosis not present

## 2022-02-14 DIAGNOSIS — E669 Obesity, unspecified: Secondary | ICD-10-CM

## 2022-02-14 NOTE — Patient Instructions (Signed)
Diabetes Mellitus Action Plan Following a diabetes action plan is a way for you to manage your diabetes (diabetes mellitus) symptoms. The plan is color-coded to help you understand what actions you need to take based on any symptoms you are having. If you have symptoms in the red zone, you need medical care right away. If you have symptoms in the yellow zone, you are having problems. If you have symptoms in the green zone, you are doing well. Learning about and understanding diabetes can take time. Follow the plan that you develop with your health care provider. Know the target range for your blood sugar (glucose) level, and review your treatment plan with your health care provider at each visit. The target range for my blood sugar level is __________________________ mg/dL. Red zone Get medical help right away if you have any of the following symptoms: A blood sugar test result that is below 54 mg/dL (3 mmol/L). A blood sugar test result that is at or above 240 mg/dL (13.3 mmol/L) for 2 days in a row. Confusion or trouble thinking clearly. Difficulty breathing. Sickness or a fever for 2 or more days that is not getting better. Moderate or large ketone levels in your urine. Feeling tired or having no energy. If you have any red zone symptoms, do not wait to see if the symptoms will go away. Get medical help right away. Call your local emergency services (911 in the [place] Do not drive yourself to the hospital. If you have severely low blood sugar (severe hypoglycemia) and you cannot eat or drink, you may need glucagon. Make sure a family member or close friend knows how to check your blood sugar and how to give you glucagon. You may need to be treated in a hospital for this condition. Yellow zone If you have any of the following symptoms, your diabetes is not under control and you may need to make some changes: A blood sugar test result that is at or above 240 mg/dL (13.3 mmol/L) for 2 days in a  row. Blood sugar test results that are below 70 mg/dL (3.9 mmol/L). Other symptoms of hypoglycemia, such as: Shaking or feeling light-headed. Confusion or irritability. Feeling hungry. Having a fast heartbeat. If you have any yellow zone symptoms: Treat your hypoglycemia by eating or drinking 15 grams of a rapid-acting carbohydrate. Follow the 15:15 rule: Take 15 grams of a rapid-acting carbohydrate, such as: 1 tube of glucose gel. 4 glucose pills. 4 oz (120 mL) of fruit juice. 4 oz (120 mL) of regular (not diet) soda. Check your blood sugar 15 minutes after you take the carbohydrate. If the repeat blood sugar test is still at or below 70 mg/dL (3.9 mmol/L), take 15 grams of a carbohydrate again. If your blood sugar does not increase above 70 mg/dL (3.9 mmol/L) after 3 tries, get medical help right away. After your blood sugar returns to normal, eat a meal or a snack within 1 hour. Keep taking your daily medicines as told by your health care provider. Check your blood sugar more often than you normally would. Write down your results. Call your health care provider if you have trouble keeping your blood sugar in your target range.  Green zone These signs mean you are doing well and you can continue what you are doing to manage your diabetes: Your blood sugar is within your personal target range. For most people, a blood sugar level before a meal (preprandial) should be 80-130 mg/dL (4.4-7.2 mmol/L). You feel   well, and you are able to do daily activities. If you are in the green zone, continue to manage your diabetes as told by your health care provider. To do this: Eat a healthy diet. Exercise regularly. Check your blood sugar as told by your health care provider. Take your medicines as told by your health care provider.  Where to find more information American Diabetes Association (ADA): diabetes.org Association of Diabetes Care & Education Specialists (ADCES):  diabeteseducator.org Summary Following a diabetes action plan is a way for you to manage your diabetes symptoms. The plan is color-coded to help you understand what actions you need to take based on any symptoms you are having. Follow the plan that you develop with your health care provider. Make sure you know your personal target blood sugar level. Review your treatment plan with your health care provider at each visit. This information is not intended to replace advice given to you by your health care provider. Make sure you discuss any questions you have with your health care provider. Document Revised: 09/12/2019 Document Reviewed: 09/12/2019 Elsevier Patient Education  2023 Elsevier Inc.  

## 2022-02-14 NOTE — Progress Notes (Signed)
Diane Chaney,acting as a Education administrator for Diane Brine, FNP.,have documented all relevant documentation on the behalf of Diane Brine, FNP,as directed by  Diane Brine, FNP while in the presence of Diane Chaney, [place].    Subjective:     Patient ID: Diane Chaney , female    DOB: 08-22-1975 , 46 y.o.   MRN: [id number]   Chief Complaint  Patient presents with   Diabetes    HPI  Patient here today for DM f/u. She reports compliance with meds. She started the higher does of atorvastatin a few weeks ago. She is exercising a little bit. Patient states she is having foot pain but she is going to call sch appt.   BP Readings from Last 3 Encounters: 02/14/22 : 130/68 10/12/21 : 108/80 05/06/21 : 130/70  Had a total hysterectomy with bilateral salpingo oopherectomy   Diabetes She presents for her follow-up diabetic visit. She has type 2 diabetes mellitus. There are no hypoglycemic associated symptoms. Pertinent negatives for hypoglycemia include no confusion, dizziness or headaches. There are no diabetic associated symptoms. Pertinent negatives for diabetes include no chest pain, no polydipsia, no polyphagia and no polyuria. There are no hypoglycemic complications. Symptoms are worsening. There are no diabetic complications. Risk factors for coronary artery disease include sedentary lifestyle, obesity and diabetes mellitus. Current diabetic treatment includes oral agent (dual therapy). She has not had a previous visit with a dietitian. (She is not checking her blood sugar ) An ACE inhibitor/angiotensin II receptor blocker is being taken. She does not see a podiatrist.Eye exam is not current.     Past Medical History:  Diagnosis Date   Hyperglycemia      History reviewed. No pertinent family history.   Current Outpatient Medications:    acetaminophen (TYLENOL) 500 MG tablet, Take 1,000 mg by mouth every 6 (six) hours as needed for moderate pain., Disp: , Rfl:    atorvastatin (LIPITOR)  40 MG tablet, Take 1 tablet (40 mg total) by mouth daily., Disp: 90 tablet, Rfl: 1   insulin degludec (TRESIBA FLEXTOUCH) 100 UNIT/ML FlexTouch Pen, Inject 20 Units into the skin daily., Disp: 15 mL, Rfl: 3   losartan (COZAAR) 25 MG tablet, TAKE 1 TABLET BY MOUTH EVERY DAY, Disp: 90 tablet, Rfl: 0   Multiple Vitamin (MULTIVITAMIN WITH MINERALS) TABS tablet, Take 1 tablet by mouth daily., Disp: , Rfl:    Semaglutide (RYBELSUS) 14 MG TABS, Take 1 tablet by mouth 30 minutes after breakfast daily, Disp: 90 tablet, Rfl: 1   BD PEN NEEDLE MICRO U/F 32G X 6 MM MISC, USE AS DIRECTED DAILY (Patient not taking: Reported on 10/12/2021), Disp: 100 each, Rfl: 3   blood glucose meter kit and supplies KIT, Dispense based on patient and insurance preference. Use up to four times daily as directed. (FOR ICD-9 250.00, 250.01). (Patient not taking: Reported on 10/12/2021), Disp: 1 each, Rfl: 0   diclofenac (VOLTAREN) 75 MG EC tablet, Take 1 tablet (75 mg total) by mouth 2 (two) times daily. (Patient not taking: Reported on 10/12/2021), Disp: 50 tablet, Rfl: 2   No Known Allergies   Review of Systems  Constitutional: Negative.   Respiratory: Negative.    Cardiovascular: Negative.  Negative for chest pain, palpitations and leg swelling.  Endocrine: Negative for polydipsia, polyphagia and polyuria.  Neurological: Negative.  Negative for dizziness and headaches.  Psychiatric/Behavioral: Negative.  Negative for confusion.      Today's Vitals   02/14/22 1610  BP: 130/68  Pulse: 98  Temp: 98.3 F (36.8 C)  TempSrc: Oral  Weight: 228 lb (103.4 kg)  Height: _0  (1.727 m)  PainSc: 3   PainLoc: Foot   Body mass index is 34.67 kg/m.  Wt Readings from Last 3 Encounters:  02/14/22 228 lb (103.4 kg)  10/12/21 225 lb 12.8 oz (102.4 kg)  05/06/21 227 lb (103 kg)    Objective:  Physical Exam Vitals reviewed.  Constitutional:      General: She is not in acute distress.    Appearance: Normal appearance. She is  obese.  Cardiovascular:     Rate and Rhythm: Normal rate and regular rhythm.     Pulses: Normal pulses.     Heart sounds: Normal heart sounds. No murmur heard. Pulmonary:     Effort: Pulmonary effort is normal. No respiratory distress.     Breath sounds: Normal breath sounds. No wheezing.  Musculoskeletal:        General: No swelling.     Comments: Right foot/heel pain. She has puffiness to bilateral ankles  Skin:    General: Skin is warm and dry.     Capillary Refill: Capillary refill takes less than 2 seconds.  Neurological:     General: No focal deficit present.     Mental Status: She is alert and oriented to person, place, and time.     Cranial Nerves: No cranial nerve deficit.     Motor: No weakness.  Psychiatric:        Mood and Affect: Mood normal.        Behavior: Behavior normal.        Thought Content: Thought content normal.        Judgment: Judgment normal.         Assessment And Plan:     1. Diabetes mellitus type 2 in obese ([place]) Comments: HgbA1c was slightly elevated at last visit, will check HgbA1c levels today - Hemoglobin A1c - CMP14+EGFR  2. Mixed hyperlipidemia Comments: Cholesterol levels were elevated at last visit has just increased atorvastatin in the last few weeks and tolerating well - CMP14+EGFR - Lipid panel  3. Heel pain, chronic, right Comments: she is also having tenderness to lateral malleolus, she is already established at [facility]  4. Influenza vaccination declined Patient declined influenza vaccination at this time. Explained flu numbers are rising at this time. Patient is aware that influenza vaccine prevents illness in 70% of healthy people, and reduces hospitalizations to 30-70% in elderly. This vaccine is recommended annually. Education has been provided regarding the importance of this vaccine but patient still declined. Advised may receive this vaccine at local pharmacy or Health Dept.or vaccine clinic. Aware to provide  a copy of the vaccination record if obtained from local pharmacy or Health Dept.  Pt is willing to accept risk associated with refusing vaccination.  5. Class 1 obesity due to excess calories without serious comorbidity with body mass index (BMI) of 34.0 to 34.9 in adult She is encouraged to strive for BMI less than 30 to decrease cardiac risk. Advised to aim for at least 150 minutes of exercise per week.    Patient was given opportunity to ask questions. Patient verbalized understanding of the plan and was able to repeat key elements of the plan. All questions were answered to their satisfaction.  Diane Brine, FNP   I, Diane Brine, FNP, have reviewed all documentation for this visit. The documentation on 02/14/22 for the exam, diagnosis, procedures, and orders are all accurate  and complete.   IF YOU HAVE BEEN REFERRED TO A SPECIALIST, IT MAY TAKE 1-2 WEEKS TO SCHEDULE/PROCESS THE REFERRAL. IF YOU HAVE NOT HEARD FROM US/SPECIALIST IN TWO WEEKS, PLEASE GIVE US A CALL AT [phone].   THE PATIENT IS ENCOURAGED TO PRACTICE SOCIAL DISTANCING DUE TO THE COVID-19 PANDEMIC.   

## 2022-02-15 LAB — CMP14+EGFR
ALT: 11 IU/L (ref 0–32)
AST: 12 IU/L (ref 0–40)
Albumin/Globulin Ratio: 1.4 (ref 1.2–2.2)
Albumin: 4.3 g/dL (ref 3.9–4.9)
Alkaline Phosphatase: 102 IU/L (ref 44–121)
BUN/Creatinine Ratio: 20 (ref 9–23)
BUN: 14 mg/dL (ref 6–24)
Bilirubin Total: 0.3 mg/dL (ref 0.0–1.2)
CO2: 21 mmol/L (ref 20–29)
Calcium: 9.2 mg/dL (ref 8.7–10.2)
Chloride: 102 mmol/L (ref 96–106)
Creatinine, Ser: 0.69 mg/dL (ref 0.57–1.00)
Globulin, Total: 3.1 g/dL (ref 1.5–4.5)
Glucose: 179 mg/dL — ABNORMAL HIGH (ref 70–99)
Potassium: 4.2 mmol/L (ref 3.5–5.2)
Sodium: 141 mmol/L (ref 134–144)
Total Protein: 7.4 g/dL (ref 6.0–8.5)
eGFR: 109 mL/min/{1.73_m2} (ref >59)

## 2022-02-15 LAB — LIPID PANEL
Chol/HDL Ratio: 3.5 ratio (ref 0.0–4.4)
Cholesterol, Total: 176 mg/dL (ref 100–199)
HDL: 51 mg/dL (ref >39)
LDL Chol Calc (NIH): 103 mg/dL — ABNORMAL HIGH (ref 0–99)
Triglycerides: 123 mg/dL (ref 0–149)
VLDL Cholesterol Cal: 22 mg/dL (ref 5–40)

## 2022-02-15 LAB — HEMOGLOBIN A1C
Est. average glucose Bld gHb Est-mCnc: 189 mg/dL
Hgb A1c MFr Bld: 8.2 % — ABNORMAL HIGH (ref 4.8–5.6)

## 2022-03-22 ENCOUNTER — Other Ambulatory Visit: Payer: Self-pay | Admitting: Nurse Practitioner

## 2022-03-22 DIAGNOSIS — E119 Type 2 diabetes mellitus without complications: Secondary | ICD-10-CM

## 2022-03-29 LAB — HM DIABETES EYE EXAM

## 2022-04-20 ENCOUNTER — Other Ambulatory Visit: Payer: Self-pay | Admitting: Nurse Practitioner

## 2022-04-20 DIAGNOSIS — E1165 Type 2 diabetes mellitus with hyperglycemia: Secondary | ICD-10-CM

## 2022-05-09 DIAGNOSIS — E1169 Type 2 diabetes mellitus with other specified complication: Secondary | ICD-10-CM | POA: Insufficient documentation

## 2022-05-09 DIAGNOSIS — N951 Menopausal and female climacteric states: Secondary | ICD-10-CM | POA: Insufficient documentation

## 2022-05-10 ENCOUNTER — Encounter: Payer: Self-pay | Admitting: Nurse Practitioner

## 2022-05-10 ENCOUNTER — Ambulatory Visit (INDEPENDENT_AMBULATORY_CARE_PROVIDER_SITE_OTHER): Payer: BC Managed Care – PPO | Admitting: Nurse Practitioner

## 2022-05-10 VITALS — BP 118/78 | HR 91 | Temp 98.0°F | Ht 68.0 in | Wt 222.0 lb

## 2022-05-10 DIAGNOSIS — E1169 Type 2 diabetes mellitus with other specified complication: Secondary | ICD-10-CM | POA: Diagnosis not present

## 2022-05-10 DIAGNOSIS — Z0000 Encounter for general adult medical examination without abnormal findings: Secondary | ICD-10-CM

## 2022-05-10 DIAGNOSIS — E6609 Other obesity due to excess calories: Secondary | ICD-10-CM

## 2022-05-10 DIAGNOSIS — Z6833 Body mass index (BMI) 33.0-33.9, adult: Secondary | ICD-10-CM

## 2022-05-10 DIAGNOSIS — Z1321 Encounter for screening for nutritional disorder: Secondary | ICD-10-CM

## 2022-05-10 DIAGNOSIS — M7989 Other specified soft tissue disorders: Secondary | ICD-10-CM

## 2022-05-10 DIAGNOSIS — E782 Mixed hyperlipidemia: Secondary | ICD-10-CM

## 2022-05-10 DIAGNOSIS — Z23 Encounter for immunization: Secondary | ICD-10-CM

## 2022-05-10 DIAGNOSIS — E669 Obesity, unspecified: Secondary | ICD-10-CM

## 2022-05-10 MED ORDER — RYBELSUS 14 MG PO TABS: ORAL_TABLET | ORAL | 1 refills | Status: DC

## 2022-05-10 MED ORDER — FUROSEMIDE 20 MG PO TABS: 20.0000 mg | ORAL_TABLET | Freq: Every day | ORAL | 2 refills | Status: DC

## 2022-05-10 NOTE — Progress Notes (Signed)
I,Sheena H Holbrook,acting as a Education administrator for Diane Brine, FNP.,have documented all relevant documentation on the behalf of Diane Brine, FNP,as directed by  Diane Brine, FNP while in the presence of Diane Chaney, [place].   Subjective:     Patient ID: Diane Chaney , female    DOB: [birth year] , 47 y.o.   MRN: [id number]   Chief Complaint  Patient presents with   Annual Exam    HPI  Patient presents today for annual exam. She has been to the eye doctor, no issues. Patient has no other complaints or concerns.   Wt Readings from Last 3 Encounters: 05/10/22 : 222 lb (100.7 kg) 02/14/22 : 228 lb (103.4 kg) 10/12/21 : 225 lb 12.8 oz (102.4 kg)  She continues to have pain to her right foot thought to be plantar fascitis per the podiatrist.   Diabetes She presents for her follow-up diabetic visit. She has type 2 diabetes mellitus. There are no hypoglycemic associated symptoms. Pertinent negatives for hypoglycemia include no confusion, dizziness or headaches. There are no diabetic associated symptoms. Pertinent negatives for diabetes include no chest pain, no polydipsia, no polyphagia and no polyuria. There are no hypoglycemic complications. Symptoms are worsening. There are no diabetic complications. Risk factors for coronary artery disease include sedentary lifestyle, obesity and diabetes mellitus. Current diabetic treatment includes oral agent (dual therapy). She has not had a previous visit with a dietitian. (Blood sugars 70-80 in morning. Yesterday was 90. She did not take her medications yesterday due to thinking she took a double dose the day before. ) An ACE inhibitor/angiotensin II receptor blocker is being taken. She does not see a podiatrist.Eye exam is not current.     Past Medical History:  Diagnosis Date   Hyperglycemia      History reviewed. No pertinent family history.   Current Outpatient Medications:    acetaminophen (TYLENOL) 500 MG tablet, Take 1,000 mg by mouth every  6 (six) hours as needed for moderate pain., Disp: , Rfl:    atorvastatin (LIPITOR) 20 MG tablet, Take 20 mg by mouth daily., Disp: , Rfl:    BD PEN NEEDLE MICRO U/F 32G X 6 MM MISC, USE AS DIRECTED, Disp: 300 each, Rfl: 1   blood glucose meter kit and supplies KIT, Dispense based on patient and insurance preference. Use up to four times daily as directed. (FOR ICD-9 250.00, 250.01)., Disp: 1 each, Rfl: 0   furosemide (LASIX) 20 MG tablet, Take 1 tablet (20 mg total) by mouth daily. X 3 days then as needed for swelling, Disp: 30 tablet, Rfl: 2   insulin degludec (TRESIBA FLEXTOUCH) 100 UNIT/ML FlexTouch Pen, Inject 20 Units into the skin daily., Disp: 15 mL, Rfl: 3   losartan (COZAAR) 25 MG tablet, TAKE 1 TABLET BY MOUTH EVERY DAY, Disp: 90 tablet, Rfl: 0   Multiple Vitamin (MULTIVITAMIN WITH MINERALS) TABS tablet, Take 1 tablet by mouth daily., Disp: , Rfl:    Semaglutide (RYBELSUS) 14 MG TABS, Take 1 tablet by mouth 30 minutes after breakfast daily, Disp: 90 tablet, Rfl: 1   No Known Allergies    The patient states she is status post hysterectomy. Patient's last menstrual period was 06/22/2010.. Negative for Dysmenorrhea and Negative for Menorrhagia. Negative for: breast discharge, breast lump(s), breast pain and breast self exam. Associated symptoms include abnormal vaginal bleeding. Pertinent negatives include abnormal bleeding (hematology), anxiety, decreased libido, depression, difficulty falling sleep, dyspareunia, history of infertility, nocturia, sexual dysfunction, sleep disturbances, urinary incontinence, urinary urgency, vaginal  discharge and vaginal itching. Diet regular.The patient states her exercise level is moderate with 1-3 days. She is going to a therapist for her eating Jarrett Soho). Works with eating disorders.she feels like it is helping. She is watching what goes in her mouth without under or overeating.   . The patient's tobacco use is:  Social History   Tobacco Use  Smoking  Status Never  Smokeless Tobacco Never   She has been exposed to passive smoke. The patient's alcohol use is:  Social History   Substance and Sexual Activity  Alcohol Use Never     Review of Systems  Constitutional: Negative.   HENT: Negative.    Eyes: Negative.   Respiratory: Negative.    Cardiovascular: Negative.  Negative for chest pain, palpitations and leg swelling.  Gastrointestinal: Negative.   Endocrine: Negative.  Negative for polydipsia, polyphagia and polyuria.  Genitourinary: Negative.   Musculoskeletal: Negative.   Skin: Negative.   Allergic/Immunologic: Negative.   Neurological: Negative.  Negative for dizziness and headaches.  Hematological: Negative.   Psychiatric/Behavioral: Negative.  Negative for confusion.      Today's Vitals   05/10/22 1409  BP: 118/78  Pulse: 91  Temp: 98 F (36.7 C)  TempSrc: Oral  SpO2: 97%  Weight: 222 lb (100.7 kg)  Height: '5\' 8"'$  (1.727 m)   Body mass index is 33.75 kg/m.   Objective:  Physical Exam Vitals reviewed.  Constitutional:      General: She is not in acute distress.    Appearance: Normal appearance. She is well-developed. She is obese.  HENT:     Head: Normocephalic and atraumatic.     Right Ear: Hearing, tympanic membrane, ear canal and external ear normal. There is no impacted cerumen.     Left Ear: Hearing, tympanic membrane, ear canal and external ear normal. There is no impacted cerumen.     Nose:     Comments: Deferred - masked    Mouth/Throat:     Comments: Deferred - masked Eyes:     General: Lids are normal.     Extraocular Movements: Extraocular movements intact.     Conjunctiva/sclera: Conjunctivae normal.     Pupils: Pupils are equal, round, and reactive to light.     Funduscopic exam:    Right eye: No papilledema.        Left eye: No papilledema.  Neck:     Thyroid: No thyroid mass.     Vascular: No carotid bruit.  Cardiovascular:     Rate and Rhythm: Normal rate and regular rhythm.      Pulses: Normal pulses.     Heart sounds: Normal heart sounds. No murmur heard. Pulmonary:     Effort: Pulmonary effort is normal. No respiratory distress.     Breath sounds: Normal breath sounds. No wheezing.  Chest:     Chest wall: No mass.  Breasts:    Tanner Score is 5.     Right: Normal. No mass or tenderness.     Left: Normal. No mass or tenderness.  Abdominal:     General: Abdomen is flat. Bowel sounds are normal. There is no distension.     Palpations: Abdomen is soft.     Tenderness: There is no abdominal tenderness.  Genitourinary:    Rectum: Guaiac result negative.  Musculoskeletal:        General: Swelling (non pitting swelling) and tenderness (right dorsal surface of right foot) present. Normal range of motion.     Cervical back: Full passive  range of motion without pain, normal range of motion and neck supple.     Right lower leg: No edema.     Left lower leg: No edema.  Lymphadenopathy:     Upper Body:     Right upper body: No supraclavicular, axillary or pectoral adenopathy.     Left upper body: No supraclavicular, axillary or pectoral adenopathy.  Skin:    General: Skin is warm and dry.     Capillary Refill: Capillary refill takes less than 2 seconds.     Comments: She does have a hyperpigmented area to right anterior dorsal surface of right foot.  Neurological:     General: No focal deficit present.     Mental Status: She is alert and oriented to person, place, and time.     Cranial Nerves: No cranial nerve deficit.     Sensory: No sensory deficit.     Motor: No weakness.  Psychiatric:        Mood and Affect: Mood normal.        Behavior: Behavior normal.        Thought Content: Thought content normal.        Judgment: Judgment normal.         Assessment And Plan:     1. Encounter for general adult medical examination w/o abnormal findings - CBC - CMP14+EGFR  2. Encounter for vitamin deficiency screening - VITAMIN D 25 Hydroxy (Vit-D Deficiency,  Fractures)  3. Diabetes mellitus type 2 in obese [facility]) Comments: She is feeling better and reports decreasing blood sugars. Will check HgbA1c. - Hemoglobin A1c - Microalbumin / creatinine urine ratio - Semaglutide (RYBELSUS) 14 MG TABS; Take 1 tablet by mouth 30 minutes after breakfast daily  Dispense: 90 tablet; Refill: 1  4. Mixed hyperlipidemia Comments: Cholesterol levels are stable. Continue statin. - Lipid panel  5. Localized swelling of lower extremity Comments: Will treat with furosemide as needed, trace edema noted - furosemide (LASIX) 20 MG tablet; Take 1 tablet (20 mg total) by mouth daily. X 3 days then as needed for swelling  Dispense: 30 tablet; Refill: 2  6. BMI 33.0-33.9,adult She is encouraged to strive for BMI less than 30 to decrease cardiac risk. Advised to aim for at least 150 minutes of exercise per week.  7. Need for COVID-19 vaccine Covid vaccine received no adverse reaction after 15 minute observation. [employer] Fall 2023 Covid-19 Vaccine 30yr and older    Patient was given opportunity to ask questions. Patient verbalized understanding of the plan and was able to repeat key elements of the plan. All questions were answered to their satisfaction.   JMinette Brine FNP   I, JMinette Brine FNP, have reviewed all documentation for this visit. The documentation on 05/10/22 for the exam, diagnosis, procedures, and orders are all accurate and complete.   THE PATIENT IS ENCOURAGED TO PRACTICE SOCIAL DISTANCING DUE TO THE COVID-19 PANDEMIC.

## 2022-05-10 NOTE — Patient Instructions (Signed)

## 2022-05-11 LAB — CMP14+EGFR
ALT: 11 IU/L (ref 0–32)
AST: 12 IU/L (ref 0–40)
Albumin/Globulin Ratio: 1.6 (ref 1.2–2.2)
Albumin: 4.3 g/dL (ref 3.9–4.9)
Alkaline Phosphatase: 84 IU/L (ref 44–121)
BUN/Creatinine Ratio: 18 (ref 9–23)
BUN: 14 mg/dL (ref 6–24)
Bilirubin Total: 0.2 mg/dL (ref 0.0–1.2)
CO2: 24 mmol/L (ref 20–29)
Calcium: 9.2 mg/dL (ref 8.7–10.2)
Chloride: 104 mmol/L (ref 96–106)
Creatinine, Ser: 0.76 mg/dL (ref 0.57–1.00)
Globulin, Total: 2.7 g/dL (ref 1.5–4.5)
Glucose: 87 mg/dL (ref 70–99)
Potassium: 4.6 mmol/L (ref 3.5–5.2)
Sodium: 142 mmol/L (ref 134–144)
Total Protein: 7 g/dL (ref 6.0–8.5)
eGFR: 98 mL/min/{1.73_m2} (ref >59)

## 2022-05-11 LAB — LIPID PANEL
Chol/HDL Ratio: 3.9 ratio (ref 0.0–4.4)
Cholesterol, Total: 186 mg/dL (ref 100–199)
HDL: 48 mg/dL (ref >39)
LDL Chol Calc (NIH): 119 mg/dL — ABNORMAL HIGH (ref 0–99)
Triglycerides: 106 mg/dL (ref 0–149)
VLDL Cholesterol Cal: 19 mg/dL (ref 5–40)

## 2022-05-11 LAB — CBC
Hematocrit: 37.5 % (ref 34.0–46.6)
Hemoglobin: 11.8 g/dL (ref 11.1–15.9)
MCH: 22.5 pg — ABNORMAL LOW (ref 26.6–33.0)
MCHC: 31.5 g/dL (ref 31.5–35.7)
MCV: 72 fL — ABNORMAL LOW (ref 79–97)
Platelets: 321 10*3/uL (ref 150–450)
RBC: 5.24 x10E6/uL (ref 3.77–5.28)
RDW: 13.9 % (ref 11.7–15.4)
WBC: 7.2 10*3/uL (ref 3.4–10.8)

## 2022-05-11 LAB — VITAMIN D 25 HYDROXY (VIT D DEFICIENCY, FRACTURES): Vit D, 25-Hydroxy: 31.3 ng/mL (ref 30.0–100.0)

## 2022-05-11 LAB — MICROALBUMIN / CREATININE URINE RATIO
Creatinine, Urine: 211.4 mg/dL
Microalb/Creat Ratio: 4 mg/g creat (ref 0–29)
Microalbumin, Urine: 9.5 ug/mL

## 2022-05-11 LAB — HEMOGLOBIN A1C
Est. average glucose Bld gHb Est-mCnc: 154 mg/dL
Hgb A1c MFr Bld: 7 % — ABNORMAL HIGH (ref 4.8–5.6)

## 2022-05-28 ENCOUNTER — Other Ambulatory Visit: Payer: Self-pay | Admitting: Nurse Practitioner

## 2022-05-28 DIAGNOSIS — E1169 Type 2 diabetes mellitus with other specified complication: Secondary | ICD-10-CM

## 2022-06-26 ENCOUNTER — Other Ambulatory Visit: Payer: Self-pay | Admitting: Nurse Practitioner

## 2022-06-26 DIAGNOSIS — E1169 Type 2 diabetes mellitus with other specified complication: Secondary | ICD-10-CM

## 2022-07-21 ENCOUNTER — Other Ambulatory Visit: Payer: Self-pay | Admitting: Nurse Practitioner

## 2022-07-21 DIAGNOSIS — E1165 Type 2 diabetes mellitus with hyperglycemia: Secondary | ICD-10-CM

## 2022-07-29 ENCOUNTER — Other Ambulatory Visit: Payer: Self-pay | Admitting: Nurse Practitioner

## 2022-07-29 DIAGNOSIS — E1169 Type 2 diabetes mellitus with other specified complication: Secondary | ICD-10-CM

## 2022-08-09 ENCOUNTER — Other Ambulatory Visit: Payer: Self-pay | Admitting: Nurse Practitioner

## 2022-08-09 DIAGNOSIS — M7989 Other specified soft tissue disorders: Secondary | ICD-10-CM

## 2022-08-31 ENCOUNTER — Other Ambulatory Visit: Payer: Self-pay | Admitting: Nurse Practitioner

## 2022-08-31 DIAGNOSIS — E1169 Type 2 diabetes mellitus with other specified complication: Secondary | ICD-10-CM

## 2022-09-08 ENCOUNTER — Ambulatory Visit: Payer: BC Managed Care – PPO | Admitting: Nurse Practitioner

## 2022-09-13 ENCOUNTER — Encounter: Payer: Self-pay | Admitting: Nurse Practitioner

## 2022-09-13 ENCOUNTER — Ambulatory Visit: Payer: BC Managed Care – PPO | Admitting: Nurse Practitioner

## 2022-09-13 VITALS — BP 100/70 | HR 98 | Temp 98.4°F | Ht 68.0 in | Wt 223.2 lb

## 2022-09-13 DIAGNOSIS — E1169 Type 2 diabetes mellitus with other specified complication: Secondary | ICD-10-CM | POA: Diagnosis not present

## 2022-09-13 DIAGNOSIS — E669 Obesity, unspecified: Secondary | ICD-10-CM

## 2022-09-13 DIAGNOSIS — Z6833 Body mass index (BMI) 33.0-33.9, adult: Secondary | ICD-10-CM | POA: Diagnosis not present

## 2022-09-13 DIAGNOSIS — E782 Mixed hyperlipidemia: Secondary | ICD-10-CM | POA: Diagnosis not present

## 2022-09-13 LAB — HEMOGLOBIN A1C
Est. average glucose Bld gHb Est-mCnc: 154 mg/dL
Hgb A1c MFr Bld: 7 % — ABNORMAL HIGH (ref 4.8–5.6)

## 2022-09-13 MED ORDER — RYBELSUS 14 MG PO TABS: ORAL_TABLET | ORAL | 1 refills | Status: DC

## 2022-09-13 NOTE — Progress Notes (Signed)
Madelaine Bhat, CMA,acting as a Neurosurgeon for Arnette Felts, FNP.,have documented all relevant documentation on the behalf of Arnette Felts, FNP,as directed by  Arnette Felts, FNP while in the presence of Arnette Felts, FNP.  Subjective:  Patient ID: Diane Chaney , female    DOB: Apr 17, 1975 , 47 y.o.   MRN: [id number]  Chief Complaint  Patient presents with   Hypertension    HPI  Patient presents today for DM, and chol check. Patient reports compliance with medications and has no other concerns today. Patient denies any chest pains, SOB, or headaches. She does admit to eating more due to her recent mental health dive with her therapist.   BP Readings from Last 3 Encounters: 09/13/22 : 100/70 05/10/22 : 118/78 02/14/22 : 130/68    Diabetes She presents for her follow-up diabetic visit. She has type 2 diabetes mellitus. There are no hypoglycemic associated symptoms. Pertinent negatives for hypoglycemia include no confusion, dizziness or headaches. There are no diabetic associated symptoms. Pertinent negatives for diabetes include no chest pain, no polydipsia, no polyphagia and no polyuria. There are no hypoglycemic complications. Symptoms are worsening. There are no diabetic complications. Risk factors for coronary artery disease include sedentary lifestyle, obesity and diabetes mellitus. Current diabetic treatment includes oral agent (dual therapy). She is following a generally unhealthy diet. When asked about meal planning, she reported none. She has not had a previous visit with a dietitian. She participates in exercise intermittently (2-5 times a week for 30 minutes sometimes can be one hour or more). (Blood sugar today was 112 has been as low 92. ) An ACE inhibitor/angiotensin II receptor blocker is being taken. She does not see a podiatrist.Eye exam is current.     Past Medical History:  Diagnosis Date   Hyperglycemia      History reviewed. No pertinent family history.   Current  Outpatient Medications:    acetaminophen (TYLENOL) 500 MG tablet, Take 1,000 mg by mouth every 6 (six) hours as needed for moderate pain., Disp: , Rfl:    atorvastatin (LIPITOR) 20 MG tablet, TAKE 1 TABLET BY MOUTH EVERY DAY, Disp: 90 tablet, Rfl: 1   BD PEN NEEDLE MICRO U/F 32G X 6 MM MISC, USE AS DIRECTED, Disp: 300 each, Rfl: 1   blood glucose meter kit and supplies KIT, Dispense based on patient and insurance preference. Use up to four times daily as directed. (FOR ICD-9 250.00, 250.01)., Disp: 1 each, Rfl: 0   furosemide (LASIX) 20 MG tablet, TAKE 1 TABLET (20 MG TOTAL) BY MOUTH DAILY. X 3 DAYS THEN AS NEEDED FOR SWELLING, Disp: 90 tablet, Rfl: 2   insulin degludec (TRESIBA FLEXTOUCH) 100 UNIT/ML FlexTouch Pen, Inject 20 Units into the skin daily., Disp: 15 mL, Rfl: 3   losartan (COZAAR) 25 MG tablet, TAKE 1 TABLET BY MOUTH EVERY DAY, Disp: 90 tablet, Rfl: 0   Multiple Vitamin (MULTIVITAMIN WITH MINERALS) TABS tablet, Take 1 tablet by mouth daily., Disp: , Rfl:    Semaglutide (RYBELSUS) 14 MG TABS, Take 1 tablet by mouth daily 30 minutes before breakfast, Disp: 90 tablet, Rfl: 1   No Known Allergies   Review of Systems  Constitutional: Negative.   HENT: Negative.    Eyes: Negative.   Respiratory: Negative.    Cardiovascular: Negative.  Negative for chest pain.  Gastrointestinal: Negative.   Endocrine: Negative for polydipsia, polyphagia and polyuria.  Neurological:  Negative for dizziness and headaches.  Psychiatric/Behavioral:  Negative for confusion.  Today's Vitals   09/13/22 1048  BP: 100/70  Pulse: 98  Temp: 98.4 F (36.9 C)  TempSrc: Oral  Weight: 223 lb 3.2 oz (101.2 kg)  Height: 5\' 8"  (1.727 m)  PainSc: 0-No pain   Body mass index is 33.94 kg/m.  Wt Readings from Last 3 Encounters:  09/13/22 223 lb 3.2 oz (101.2 kg)  05/10/22 222 lb (100.7 kg)  02/14/22 228 lb (103.4 kg)     Objective:  Physical Exam Vitals reviewed.  Constitutional:      General: She  is not in acute distress.    Appearance: Normal appearance. She is obese.  Cardiovascular:     Rate and Rhythm: Normal rate and regular rhythm.     Pulses: Normal pulses.     Heart sounds: Normal heart sounds. No murmur heard. Pulmonary:     Effort: Pulmonary effort is normal. No respiratory distress.     Breath sounds: Normal breath sounds. No wheezing.  Musculoskeletal:        General: No swelling.  Skin:    General: Skin is warm and dry.     Capillary Refill: Capillary refill takes less than 2 seconds.  Neurological:     General: No focal deficit present.     Mental Status: She is alert and oriented to person, place, and time.     Cranial Nerves: No cranial nerve deficit.     Motor: No weakness.  Psychiatric:        Mood and Affect: Mood normal.        Behavior: Behavior normal.        Thought Content: Thought content normal.        Judgment: Judgment normal.         Assessment And Plan:  1. Type 2 diabetes mellitus with obesity (HCC) Comments: HgbA1c was improved at last visit, will check HgbA1c today. Continue current medications - Hemoglobin A1c - Semaglutide (RYBELSUS) 14 MG TABS; Take 1 tablet by mouth daily 30 minutes before breakfast  Dispense: 90 tablet; Refill: 1  2. Mixed hyperlipidemia Comments: LDL not at goal increased at last visit, continue statin, tolerating well. Will check lipid panel may need to increase medications - Lipid panel    Return for controlled DM check 4 months.   Patient was given opportunity to ask questions. Patient verbalized understanding of the plan and was able to repeat key elements of the plan. All questions were answered to their satisfaction.  Arnette Felts, FNP  I, Arnette Felts, FNP, have reviewed all documentation for this visit. The documentation on 09/13/22 for the exam, diagnosis, procedures, and orders are all accurate and complete.   IF YOU HAVE BEEN REFERRED TO A SPECIALIST, IT MAY TAKE 1-2 WEEKS TO SCHEDULE/PROCESS THE  REFERRAL. IF YOU HAVE NOT HEARD FROM US/SPECIALIST IN TWO WEEKS, PLEASE GIVE [place] A CALL AT [phone].

## 2022-09-14 LAB — LIPID PANEL
Chol/HDL Ratio: 2.9 ratio (ref 0.0–4.4)
Cholesterol, Total: 176 mg/dL (ref 100–199)
HDL: 60 mg/dL (ref >39)
LDL Chol Calc (NIH): 97 mg/dL (ref 0–99)
Triglycerides: 103 mg/dL (ref 0–149)
VLDL Cholesterol Cal: 19 mg/dL (ref 5–40)

## 2022-11-07 ENCOUNTER — Other Ambulatory Visit: Payer: Self-pay | Admitting: Nurse Practitioner

## 2022-11-07 DIAGNOSIS — E1165 Type 2 diabetes mellitus with hyperglycemia: Secondary | ICD-10-CM

## 2022-11-08 ENCOUNTER — Other Ambulatory Visit: Payer: Self-pay | Admitting: Nurse Practitioner

## 2022-11-08 DIAGNOSIS — E669 Obesity, unspecified: Secondary | ICD-10-CM

## 2023-01-03 ENCOUNTER — Other Ambulatory Visit: Payer: Self-pay

## 2023-01-03 DIAGNOSIS — E1169 Type 2 diabetes mellitus with other specified complication: Secondary | ICD-10-CM

## 2023-01-03 MED ORDER — RYBELSUS 14 MG PO TABS
ORAL_TABLET | ORAL | 1 refills | Status: DC
Start: 2023-01-03 — End: 2023-07-25

## 2023-01-16 ENCOUNTER — Ambulatory Visit: Payer: BC Managed Care – PPO | Admitting: Nurse Practitioner

## 2023-01-23 ENCOUNTER — Encounter: Payer: Self-pay | Admitting: Nurse Practitioner

## 2023-01-23 ENCOUNTER — Ambulatory Visit: Payer: BC Managed Care – PPO | Admitting: Nurse Practitioner

## 2023-01-23 VITALS — BP 108/72 | HR 89 | Temp 98.2°F | Ht 68.0 in | Wt 223.2 lb

## 2023-01-23 DIAGNOSIS — Z2821 Immunization not carried out because of patient refusal: Secondary | ICD-10-CM

## 2023-01-23 DIAGNOSIS — E669 Obesity, unspecified: Secondary | ICD-10-CM

## 2023-01-23 DIAGNOSIS — E1169 Type 2 diabetes mellitus with other specified complication: Secondary | ICD-10-CM

## 2023-01-23 DIAGNOSIS — Z6833 Body mass index (BMI) 33.0-33.9, adult: Secondary | ICD-10-CM | POA: Diagnosis not present

## 2023-01-23 DIAGNOSIS — E782 Mixed hyperlipidemia: Secondary | ICD-10-CM | POA: Diagnosis not present

## 2023-01-23 NOTE — Progress Notes (Signed)
Madelaine Bhat, CMA,acting as a Neurosurgeon for Arnette Felts, FNP.,have documented all relevant documentation on the behalf of Arnette Felts, FNP,as directed by  Arnette Felts, FNP while in the presence of Arnette Felts, FNP.  Subjective:  Patient ID: Diane Chaney , female    DOB: 1975/12/14 , 47 y.o.   MRN: [id number]  Chief Complaint  Patient presents with   Diabetes    HPI  Patient presents today for a dm follow up, Patient reports compliance with medication. Patient denies any chest pain, SOB, or headaches. Patient has no concerns today. She is getting more steps in now 7,000-10,000 steps a day.   BP Readings from Last 3 Encounters: 01/23/23 : 108/72 09/13/22 : 100/70 05/10/22 : 118/78     Diabetes She presents for her follow-up diabetic visit. She has type 2 diabetes mellitus. There are no hypoglycemic associated symptoms. Pertinent negatives for hypoglycemia include no confusion, dizziness or headaches. There are no diabetic associated symptoms. Pertinent negatives for diabetes include no chest pain, no polydipsia, no polyphagia and no polyuria. There are no hypoglycemic complications. Symptoms are worsening. There are no diabetic complications. Risk factors for coronary artery disease include sedentary lifestyle, obesity and diabetes mellitus. Current diabetic treatment includes oral agent (dual therapy). She is following a generally unhealthy diet. When asked about meal planning, she reported none. She has not had a previous visit with a dietitian. She participates in exercise intermittently (2-5 times a week for 30 minutes sometimes can be one hour or more). (The last time she checked blood sugar was 175 in am. She has not had her Rybelsus for one month. ) An ACE inhibitor/angiotensin II receptor blocker is being taken. She does not see a podiatrist.Eye exam is current.     Past Medical History:  Diagnosis Date   Hyperglycemia      History reviewed. No pertinent family  history.   Current Outpatient Medications:    acetaminophen (TYLENOL) 500 MG tablet, Take 1,000 mg by mouth every 6 (six) hours as needed for moderate pain., Disp: , Rfl:    BD PEN NEEDLE MICRO U/F 32G X 6 MM MISC, USE AS DIRECTED, Disp: 300 each, Rfl: 1   blood glucose meter kit and supplies KIT, Dispense based on patient and insurance preference. Use up to four times daily as directed. (FOR ICD-9 250.00, 250.01)., Disp: 1 each, Rfl: 0   furosemide (LASIX) 20 MG tablet, TAKE 1 TABLET (20 MG TOTAL) BY MOUTH DAILY. X 3 DAYS THEN AS NEEDED FOR SWELLING, Disp: 90 tablet, Rfl: 2   insulin degludec (TRESIBA FLEXTOUCH) 100 UNIT/ML FlexTouch Pen, INJECT 20 UNITS INTO THE SKIN DAILY, Disp: 15 mL, Rfl: 3   losartan (COZAAR) 25 MG tablet, TAKE 1 TABLET BY MOUTH EVERY DAY, Disp: 90 tablet, Rfl: 0   Multiple Vitamin (MULTIVITAMIN WITH MINERALS) TABS tablet, Take 1 tablet by mouth daily., Disp: , Rfl:    Semaglutide (RYBELSUS) 14 MG TABS, Take 1 tablet by mouth daily 30 minutes before breakfast, Disp: 90 tablet, Rfl: 1   atorvastatin (LIPITOR) 20 MG tablet, TAKE 1 TABLET BY MOUTH EVERY DAY, Disp: 90 tablet, Rfl: 1   No Known Allergies   Review of Systems  Constitutional: Negative.   HENT: Negative.    Eyes: Negative.   Respiratory: Negative.    Cardiovascular: Negative.  Negative for chest pain.  Gastrointestinal: Negative.   Endocrine: Negative for polydipsia, polyphagia and polyuria.  Neurological: Negative.  Negative for dizziness and headaches.  Psychiatric/Behavioral: Negative.  Negative  for confusion.      Today's Vitals   01/23/23 1627  BP: 108/72  Pulse: 89  Temp: 98.2 F (36.8 C)  TempSrc: Oral  Weight: 223 lb 3.2 oz (101.2 kg)  Height: 5\' 8"  (1.727 m)  PainSc: 0-No pain   Body mass index is 33.94 kg/m.  Wt Readings from Last 3 Encounters:  01/23/23 223 lb 3.2 oz (101.2 kg)  09/13/22 223 lb 3.2 oz (101.2 kg)  05/10/22 222 lb (100.7 kg)     Objective:  Physical Exam Vitals  reviewed.  Constitutional:      General: She is not in acute distress.    Appearance: Normal appearance. She is obese.  Cardiovascular:     Rate and Rhythm: Normal rate and regular rhythm.     Pulses: Normal pulses.     Heart sounds: Normal heart sounds. No murmur heard. Pulmonary:     Effort: Pulmonary effort is normal. No respiratory distress.     Breath sounds: Normal breath sounds. No wheezing.  Musculoskeletal:        General: No swelling.  Skin:    General: Skin is warm and dry.     Capillary Refill: Capillary refill takes less than 2 seconds.  Neurological:     General: No focal deficit present.     Mental Status: She is alert and oriented to person, place, and time.     Cranial Nerves: No cranial nerve deficit.     Motor: No weakness.  Psychiatric:        Mood and Affect: Mood normal.        Behavior: Behavior normal.        Thought Content: Thought content normal.        Judgment: Judgment normal.        Assessment And Plan:  Type 2 diabetes mellitus with obesity (HCC) Assessment & Plan: Hemoglobin A1c has been improving slowly.  She has been out of her GLP-1 for approximately 3 weeks this may affect her hemoglobin A1c.  Orders: -     CMP14+EGFR -     Hemoglobin A1c  Mixed hyperlipidemia Assessment & Plan: Cholesterol levels are stable.  Continue current medications.  Orders: -     Lipid panel -     Hemoglobin A1c  Influenza vaccination declined Assessment & Plan: Patient declined influenza vaccination at this time. Patient is aware that influenza vaccine prevents illness in 70% of healthy people, and reduces hospitalizations to 30-70% in elderly. This vaccine is recommended annually. Education has been provided regarding the importance of this vaccine but patient still declined. Advised may receive this vaccine at local pharmacy or Health Dept.or vaccine clinic. Aware to provide a copy of the vaccination record if obtained from local pharmacy or Health Dept.   Pt is willing to accept risk associated with refusing vaccination.    COVID-19 vaccination declined Assessment & Plan: Declines covid 19 vaccine. Discussed risk of covid 44 and if she changes her mind about the vaccine to call the office. Education has been provided regarding the importance of this vaccine but patient still declined. Advised may receive this vaccine at local pharmacy or Health Dept.or vaccine clinic. Aware to provide a copy of the vaccination record if obtained from local pharmacy or Health Dept.  Encouraged to take multivitamin, vitamin d, vitamin c and zinc to increase immune system. Aware can call office if would like to have vaccine here at office. Verbalized acceptance and understanding.    BMI 33.0-33.9,adult Assessment & Plan:  She is encouraged to strive for BMI less than 30 to decrease cardiac risk. Advised to aim for at least 150 minutes of exercise per week.      Return if symptoms worsen or fail to improve, for keep same next.  Patient was given opportunity to ask questions. Patient verbalized understanding of the plan and was able to repeat key elements of the plan. All questions were answered to their satisfaction.    Jeanell Sparrow, FNP, have reviewed all documentation for this visit. The documentation on 01/23/23 for the exam, diagnosis, procedures, and orders are all accurate and complete.   IF YOU HAVE BEEN REFERRED TO A SPECIALIST, IT MAY TAKE 1-2 WEEKS TO SCHEDULE/PROCESS THE REFERRAL. IF YOU HAVE NOT HEARD FROM US/SPECIALIST IN TWO WEEKS, PLEASE GIVE [place] A CALL AT [phone].

## 2023-01-24 LAB — LIPID PANEL
Chol/HDL Ratio: 3.6 ratio (ref 0.0–4.4)
Cholesterol, Total: 182 mg/dL (ref 100–199)
HDL: 51 mg/dL (ref >39)
LDL Chol Calc (NIH): 107 mg/dL — ABNORMAL HIGH (ref 0–99)
Triglycerides: 134 mg/dL (ref 0–149)
VLDL Cholesterol Cal: 24 mg/dL (ref 5–40)

## 2023-01-24 LAB — CMP14+EGFR
ALT: 16 [IU]/L (ref 0–32)
AST: 14 [IU]/L (ref 0–40)
Albumin: 4.1 g/dL (ref 3.9–4.9)
Alkaline Phosphatase: 106 [IU]/L (ref 44–121)
BUN/Creatinine Ratio: 26 — ABNORMAL HIGH (ref 9–23)
BUN: 18 mg/dL (ref 6–24)
Bilirubin Total: <0.2 mg/dL (ref 0.0–1.2)
CO2: 21 mmol/L (ref 20–29)
Calcium: 8.9 mg/dL (ref 8.7–10.2)
Chloride: 100 mmol/L (ref 96–106)
Creatinine, Ser: 0.7 mg/dL (ref 0.57–1.00)
Globulin, Total: 3 g/dL (ref 1.5–4.5)
Glucose: 343 mg/dL — ABNORMAL HIGH (ref 70–99)
Potassium: 4.3 mmol/L (ref 3.5–5.2)
Sodium: 137 mmol/L (ref 134–144)
Total Protein: 7.1 g/dL (ref 6.0–8.5)
eGFR: 108 mL/min/{1.73_m2} (ref >59)

## 2023-01-24 LAB — HEMOGLOBIN A1C
Est. average glucose Bld gHb Est-mCnc: 203 mg/dL
Hgb A1c MFr Bld: 8.7 % — ABNORMAL HIGH (ref 4.8–5.6)

## 2023-01-25 ENCOUNTER — Other Ambulatory Visit: Payer: Self-pay

## 2023-01-25 DIAGNOSIS — E1169 Type 2 diabetes mellitus with other specified complication: Secondary | ICD-10-CM

## 2023-02-01 ENCOUNTER — Other Ambulatory Visit: Payer: Self-pay | Admitting: Nurse Practitioner

## 2023-02-02 DIAGNOSIS — Z2821 Immunization not carried out because of patient refusal: Secondary | ICD-10-CM | POA: Insufficient documentation

## 2023-02-02 DIAGNOSIS — Z6833 Body mass index (BMI) 33.0-33.9, adult: Secondary | ICD-10-CM | POA: Insufficient documentation

## 2023-02-02 NOTE — Assessment & Plan Note (Signed)
Hemoglobin A1c has been improving slowly.  She has been out of her GLP-1 for approximately 3 weeks this may affect her hemoglobin A1c.

## 2023-02-02 NOTE — Assessment & Plan Note (Signed)
Cholesterol levels are stable. Continue current medications.  

## 2023-02-02 NOTE — Assessment & Plan Note (Signed)

## 2023-02-02 NOTE — Assessment & Plan Note (Signed)

## 2023-02-02 NOTE — Assessment & Plan Note (Signed)
She is encouraged to strive for BMI less than 30 to decrease cardiac risk. Advised to aim for at least 150 minutes of exercise per week.

## 2023-02-04 ENCOUNTER — Other Ambulatory Visit: Payer: Self-pay | Admitting: Nurse Practitioner

## 2023-02-04 DIAGNOSIS — E1165 Type 2 diabetes mellitus with hyperglycemia: Secondary | ICD-10-CM

## 2023-03-20 ENCOUNTER — Ambulatory Visit: Payer: BC Managed Care – PPO

## 2023-04-18 LAB — HM DIABETES EYE EXAM

## 2023-05-10 ENCOUNTER — Other Ambulatory Visit: Payer: Self-pay | Admitting: Nurse Practitioner

## 2023-05-10 DIAGNOSIS — E1165 Type 2 diabetes mellitus with hyperglycemia: Secondary | ICD-10-CM

## 2023-05-15 ENCOUNTER — Ambulatory Visit: Payer: 59 | Admitting: Nurse Practitioner

## 2023-05-15 ENCOUNTER — Encounter: Payer: Self-pay | Admitting: Nurse Practitioner

## 2023-05-15 VITALS — BP 128/60 | HR 91 | Temp 98.5°F | Ht 68.0 in | Wt 230.6 lb

## 2023-05-15 DIAGNOSIS — E782 Mixed hyperlipidemia: Secondary | ICD-10-CM

## 2023-05-15 DIAGNOSIS — E66812 Obesity, class 2: Secondary | ICD-10-CM

## 2023-05-15 DIAGNOSIS — Z79899 Other long term (current) drug therapy: Secondary | ICD-10-CM

## 2023-05-15 DIAGNOSIS — Z0000 Encounter for general adult medical examination without abnormal findings: Secondary | ICD-10-CM

## 2023-05-15 DIAGNOSIS — E6609 Other obesity due to excess calories: Secondary | ICD-10-CM

## 2023-05-15 DIAGNOSIS — Z6835 Body mass index (BMI) 35.0-35.9, adult: Secondary | ICD-10-CM

## 2023-05-15 DIAGNOSIS — E1169 Type 2 diabetes mellitus with other specified complication: Secondary | ICD-10-CM | POA: Diagnosis not present

## 2023-05-15 DIAGNOSIS — Z2821 Immunization not carried out because of patient refusal: Secondary | ICD-10-CM

## 2023-05-15 LAB — POCT URINALYSIS DIP (CLINITEK)
Bilirubin, UA: NEGATIVE
Blood, UA: NEGATIVE
Glucose, UA: 100 mg/dL — AB
Ketones, POC UA: NEGATIVE mg/dL
Leukocytes, UA: NEGATIVE
Nitrite, UA: NEGATIVE
POC PROTEIN,UA: NEGATIVE
Spec Grav, UA: >=1.03 — AB (ref 1.010–1.025)
Urobilinogen, UA: 0.2 U/dL
pH, UA: 5.5 (ref 5.0–8.0)

## 2023-05-15 NOTE — Progress Notes (Unsigned)
 Madelaine Bhat, CMA,acting as a Neurosurgeon for Arnette Felts, FNP.,have documented all relevant documentation on the behalf of Arnette Felts, FNP,as directed by  Arnette Felts, FNP while in the presence of Arnette Felts, FNP.  Subjective:    Patient ID: Diane Chaney , female    DOB: [birth year] , 48 y.o.   MRN: [id number]  Chief Complaint  Patient presents with   Annual Exam    HPI  Patient presents today for HM, Patient reports compliance with medication. Patient denies any chest pain, SOB, or headaches. Patient has no concerns today.   Diabetes She presents for her follow-up diabetic visit. She has type 2 diabetes mellitus. There are no hypoglycemic associated symptoms. Pertinent negatives for hypoglycemia include no confusion, dizziness or headaches. There are no diabetic associated symptoms. Pertinent negatives for diabetes include no chest pain, no polydipsia, no polyphagia and no polyuria. Symptoms are worsening. Risk factors for coronary artery disease include diabetes mellitus, obesity and sedentary lifestyle. Current diabetic treatment includes oral agent (dual therapy). She is compliant with treatment most of the time. (Has not been checking her blood sugars regularly)     Past Medical History:  Diagnosis Date   Hyperglycemia      No family history on file.   Current Outpatient Medications:    acetaminophen (TYLENOL) 500 MG tablet, Take 1,000 mg by mouth every 6 (six) hours as needed for moderate pain., Disp: , Rfl:    atorvastatin (LIPITOR) 20 MG tablet, TAKE 1 TABLET BY MOUTH EVERY DAY, Disp: 90 tablet, Rfl: 1   BD PEN NEEDLE MICRO U/F 32G X 6 MM MISC, USE AS DIRECTED, Disp: 300 each, Rfl: 1   blood glucose meter kit and supplies KIT, Dispense based on patient and insurance preference. Use up to four times daily as directed. (FOR ICD-9 250.00, 250.01)., Disp: 1 each, Rfl: 0   insulin degludec (TRESIBA FLEXTOUCH) 100 UNIT/ML FlexTouch Pen, INJECT 20 UNITS INTO THE SKIN DAILY,  Disp: 15 mL, Rfl: 3   losartan (COZAAR) 25 MG tablet, TAKE 1 TABLET BY MOUTH EVERY DAY, Disp: 90 tablet, Rfl: 0   Multiple Vitamin (MULTIVITAMIN WITH MINERALS) TABS tablet, Take 1 tablet by mouth daily., Disp: , Rfl:    Semaglutide (RYBELSUS) 14 MG TABS, Take 1 tablet by mouth daily 30 minutes before breakfast, Disp: 90 tablet, Rfl: 1   furosemide (LASIX) 20 MG tablet, TAKE 1 TABLET (20 MG TOTAL) BY MOUTH DAILY. X 3 DAYS THEN AS NEEDED FOR SWELLING (Patient not taking: Reported on 05/15/2023), Disp: 90 tablet, Rfl: 2   No Known Allergies    The patient states she uses status post hysterectomy for birth control. Patient's last menstrual period was 06/22/2010..  Negative for: breast discharge, breast lump(s), breast pain and breast self exam. Associated symptoms include abnormal vaginal bleeding. Pertinent negatives include abnormal bleeding (hematology), anxiety, decreased libido, depression, difficulty falling sleep, dyspareunia, history of infertility, nocturia, sexual dysfunction, sleep disturbances, urinary incontinence, urinary urgency, vaginal discharge and vaginal itching. Diet regular; admits she has not been doing well with her diet. Has been out of town. She stopped getting her meals delivered and has gotten out of routine.  The patient states her exercise level is minimal - 1-3 days a week.  She is to start back with the recovery app. She is also seeing a therapist who focuses on obesity. She feels she may be resistant to therapy.   The patient's tobacco use is:  Social History   Tobacco Use  Smoking Status  Never  Smokeless Tobacco Never  She has been exposed to passive smoke. The patient's alcohol use is:  Social History   Substance and Sexual Activity  Alcohol Use Never   Review of Systems  Constitutional: Negative.   HENT: Negative.    Eyes: Negative.   Respiratory: Negative.    Cardiovascular: Negative.  Negative for chest pain.  Gastrointestinal: Negative.   Endocrine:  Negative for polydipsia, polyphagia and polyuria.  Neurological: Negative.  Negative for dizziness and headaches.  Psychiatric/Behavioral: Negative.  Negative for confusion.      Today's Vitals   05/15/23 1506  BP: 128/60  Pulse: 91  Temp: 98.5 F (36.9 C)  TempSrc: Oral  Weight: 230 lb 9.6 oz (104.6 kg)  Height: 5\' 8"  (1.727 m)  PainSc: 0-No pain   Body mass index is 35.06 kg/m.  Wt Readings from Last 3 Encounters:  05/15/23 230 lb 9.6 oz (104.6 kg)  01/23/23 223 lb 3.2 oz (101.2 kg)  09/13/22 223 lb 3.2 oz (101.2 kg)     Objective:  Physical Exam Vitals reviewed.  Constitutional:      General: She is not in acute distress.    Appearance: Normal appearance. She is well-developed. She is obese.  HENT:     Head: Normocephalic and atraumatic.     Right Ear: Hearing, tympanic membrane, ear canal and external ear normal. There is no impacted cerumen.     Left Ear: Hearing, tympanic membrane, ear canal and external ear normal. There is no impacted cerumen.     Nose: Nose normal.     Mouth/Throat:     Mouth: Mucous membranes are moist.  Eyes:     General: Lids are normal.     Extraocular Movements: Extraocular movements intact.     Conjunctiva/sclera: Conjunctivae normal.     Pupils: Pupils are equal, round, and reactive to light.     Funduscopic exam:    Right eye: No papilledema.        Left eye: No papilledema.  Neck:     Thyroid: No thyroid mass.     Vascular: No carotid bruit.  Cardiovascular:     Rate and Rhythm: Normal rate and regular rhythm.     Pulses: Normal pulses.     Heart sounds: Normal heart sounds. No murmur heard. Pulmonary:     Effort: Pulmonary effort is normal. No respiratory distress.     Breath sounds: Normal breath sounds. No wheezing.  Chest:     Chest wall: No mass.  Breasts:    Tanner Score is 5.     Right: Normal. No mass or tenderness.     Left: Normal. No mass or tenderness.  Abdominal:     General: Abdomen is flat. Bowel sounds  are normal. There is no distension.     Palpations: Abdomen is soft.     Tenderness: There is no abdominal tenderness.  Musculoskeletal:        General: No swelling. Normal range of motion.     Cervical back: Full passive range of motion without pain, normal range of motion and neck supple.     Right lower leg: No edema.     Left lower leg: No edema.  Lymphadenopathy:     Upper Body:     Right upper body: No supraclavicular, axillary or pectoral adenopathy.     Left upper body: No supraclavicular, axillary or pectoral adenopathy.  Skin:    General: Skin is warm and dry.     Capillary Refill:  Capillary refill takes less than 2 seconds.  Neurological:     General: No focal deficit present.     Mental Status: She is alert and oriented to person, place, and time.     Cranial Nerves: No cranial nerve deficit.     Sensory: No sensory deficit.     Motor: No weakness.  Psychiatric:        Mood and Affect: Mood normal.        Behavior: Behavior normal.        Thought Content: Thought content normal.        Judgment: Judgment normal.         Assessment And Plan:     Encounter for annual health examination  Type 2 diabetes mellitus with obesity (HCC) -     EKG 12-Lead -     POCT URINALYSIS DIP (CLINITEK) -     Microalbumin / creatinine urine ratio -     CMP14+EGFR -     Hemoglobin A1c  Mixed hyperlipidemia -     CMP14+EGFR -     Lipid panel  COVID-19 vaccination declined  Class 2 obesity due to excess calories with body mass index (BMI) of 35.0 to 35.9 in adult, unspecified whether serious comorbidity present  Other long term (current) drug therapy -     CBC with Differential/Platelet     Return for 1 year physical, controlled DM check 4 months. Patient was given opportunity to ask questions. Patient verbalized understanding of the plan and was able to repeat key elements of the plan. All questions were answered to their satisfaction.   Arnette Felts, FNP  I, Arnette Felts, FNP, have reviewed all documentation for this visit. The documentation on 05/15/23 for the exam, diagnosis, procedures, and orders are all accurate and complete.

## 2023-05-16 ENCOUNTER — Encounter: Payer: Self-pay | Admitting: Nurse Practitioner

## 2023-05-16 LAB — CBC WITH DIFFERENTIAL/PLATELET
Basophils Absolute: 0 10*3/uL (ref 0.0–0.2)
Basos: 1 %
EOS (ABSOLUTE): 0.2 10*3/uL (ref 0.0–0.4)
Eos: 2 %
Hematocrit: 39.7 % (ref 34.0–46.6)
Hemoglobin: 12.4 g/dL (ref 11.1–15.9)
Immature Grans (Abs): 0 10*3/uL (ref 0.0–0.1)
Immature Granulocytes: 0 %
Lymphocytes Absolute: 2.8 10*3/uL (ref 0.7–3.1)
Lymphs: 40 %
MCH: 23 pg — ABNORMAL LOW (ref 26.6–33.0)
MCHC: 31.2 g/dL — ABNORMAL LOW (ref 31.5–35.7)
MCV: 74 fL — ABNORMAL LOW (ref 79–97)
Monocytes Absolute: 0.5 10*3/uL (ref 0.1–0.9)
Monocytes: 8 %
Neutrophils Absolute: 3.3 10*3/uL (ref 1.4–7.0)
Neutrophils: 49 %
Platelets: 328 10*3/uL (ref 150–450)
RBC: 5.4 x10E6/uL — ABNORMAL HIGH (ref 3.77–5.28)
RDW: 15 % (ref 11.7–15.4)
WBC: 6.9 10*3/uL (ref 3.4–10.8)

## 2023-05-16 LAB — MICROALBUMIN / CREATININE URINE RATIO
Creatinine, Urine: 252.1 mg/dL
Microalb/Creat Ratio: 5 mg/g{creat} (ref 0–29)
Microalbumin, Urine: 12.1 ug/mL

## 2023-05-16 LAB — LIPID PANEL
Chol/HDL Ratio: 3.6 {ratio} (ref 0.0–4.4)
Cholesterol, Total: 201 mg/dL — ABNORMAL HIGH (ref 100–199)
HDL: 56 mg/dL (ref >39)
LDL Chol Calc (NIH): 124 mg/dL — ABNORMAL HIGH (ref 0–99)
Triglycerides: 117 mg/dL (ref 0–149)
VLDL Cholesterol Cal: 21 mg/dL (ref 5–40)

## 2023-05-16 LAB — CMP14+EGFR
ALT: 14 [IU]/L (ref 0–32)
AST: 13 [IU]/L (ref 0–40)
Albumin: 4.5 g/dL (ref 3.9–4.9)
Alkaline Phosphatase: 103 [IU]/L (ref 44–121)
BUN/Creatinine Ratio: 20 (ref 9–23)
BUN: 12 mg/dL (ref 6–24)
Bilirubin Total: 0.3 mg/dL (ref 0.0–1.2)
CO2: 23 mmol/L (ref 20–29)
Calcium: 9.4 mg/dL (ref 8.7–10.2)
Chloride: 104 mmol/L (ref 96–106)
Creatinine, Ser: 0.59 mg/dL (ref 0.57–1.00)
Globulin, Total: 3 g/dL (ref 1.5–4.5)
Glucose: 86 mg/dL (ref 70–99)
Potassium: 4.3 mmol/L (ref 3.5–5.2)
Sodium: 140 mmol/L (ref 134–144)
Total Protein: 7.5 g/dL (ref 6.0–8.5)
eGFR: 112 mL/min/{1.73_m2} (ref >59)

## 2023-05-16 LAB — HEMOGLOBIN A1C
Est. average glucose Bld gHb Est-mCnc: 200 mg/dL
Hgb A1c MFr Bld: 8.6 % — ABNORMAL HIGH (ref 4.8–5.6)

## 2023-05-19 DIAGNOSIS — Z0000 Encounter for general adult medical examination without abnormal findings: Secondary | ICD-10-CM | POA: Insufficient documentation

## 2023-05-19 NOTE — Assessment & Plan Note (Signed)
 Cholesterol levels are stable. Continue current medications

## 2023-05-19 NOTE — Assessment & Plan Note (Addendum)
 Hemoglobin A1c increased at last visit.  Continue current medications. We may need to add another medication if not improved. EKG done with SR HR 92

## 2023-05-19 NOTE — Assessment & Plan Note (Signed)

## 2023-05-19 NOTE — Assessment & Plan Note (Signed)

## 2023-05-19 NOTE — Assessment & Plan Note (Signed)
 She is encouraged to strive for BMI less than 30 to decrease cardiac risk. Advised to aim for at least 150 minutes of exercise per week.

## 2023-06-26 ENCOUNTER — Other Ambulatory Visit: Payer: Self-pay | Admitting: Nurse Practitioner

## 2023-06-26 DIAGNOSIS — E119 Type 2 diabetes mellitus without complications: Secondary | ICD-10-CM

## 2023-07-25 ENCOUNTER — Other Ambulatory Visit: Payer: Self-pay

## 2023-07-25 DIAGNOSIS — E669 Obesity, unspecified: Secondary | ICD-10-CM

## 2023-07-25 MED ORDER — RYBELSUS 14 MG PO TABS: ORAL_TABLET | ORAL | 1 refills | Status: DC

## 2023-08-04 ENCOUNTER — Other Ambulatory Visit: Payer: Self-pay | Admitting: Nurse Practitioner

## 2023-08-06 ENCOUNTER — Other Ambulatory Visit: Payer: Self-pay | Admitting: Nurse Practitioner

## 2023-08-06 DIAGNOSIS — E1165 Type 2 diabetes mellitus with hyperglycemia: Secondary | ICD-10-CM

## 2023-09-12 ENCOUNTER — Encounter: Payer: Self-pay | Admitting: Nurse Practitioner

## 2023-09-12 ENCOUNTER — Ambulatory Visit (INDEPENDENT_AMBULATORY_CARE_PROVIDER_SITE_OTHER): Payer: 59 | Admitting: Nurse Practitioner

## 2023-09-12 VITALS — BP 120/70 | HR 95 | Temp 98.7°F | Ht 68.0 in | Wt 233.8 lb

## 2023-09-12 DIAGNOSIS — E782 Mixed hyperlipidemia: Secondary | ICD-10-CM | POA: Diagnosis not present

## 2023-09-12 DIAGNOSIS — E6609 Other obesity due to excess calories: Secondary | ICD-10-CM

## 2023-09-12 DIAGNOSIS — Z6835 Body mass index (BMI) 35.0-35.9, adult: Secondary | ICD-10-CM

## 2023-09-12 DIAGNOSIS — E66812 Obesity, class 2: Secondary | ICD-10-CM | POA: Diagnosis not present

## 2023-09-12 DIAGNOSIS — E1169 Type 2 diabetes mellitus with other specified complication: Secondary | ICD-10-CM | POA: Diagnosis not present

## 2023-09-12 DIAGNOSIS — Z2821 Immunization not carried out because of patient refusal: Secondary | ICD-10-CM

## 2023-09-12 NOTE — Patient Instructions (Addendum)
 Start Mounjaro when you are back from vacation in July.  You can take Lasix  20 mg by mouth as needed for leg swelling.    Goal to exercise 150 minutes per week with at least 2 days of strength training Encouraged to park further when at the store, take stairs instead of elevators and to walk in place during commercials. Increase water intake to at least one gallon of water daily.

## 2023-09-12 NOTE — Progress Notes (Signed)
 Diane Chaney, CMA,acting as a Neurosurgeon for Gaines Ada, FNP.,have documented all relevant documentation on the behalf of Gaines Ada, FNP,as directed by  Gaines Ada, FNP while in the presence of Gaines Ada, FNP.  Subjective:  Patient ID: Diane Chaney , female    DOB: 10/06/1975 , 48 y.o.   MRN: [id number]  Chief Complaint  Patient presents with   Hyperlipidemia    Patient presents today for a Chol and dm follow up, Patient reports compliance with medication. Patient denies any chest pain, SOB, or headaches. Patient has no concerns today.    Diabetes   Hand Pain    Patient reports she hit her middle finger on her right hand on her wheel about 2/3 weeks ago and it is still throbbing. She is wondering how long it will take to get better.    Wrist Pain    Patient reports her left wrist has been hurting for about a week, she reports she believes she slept on it wrong. She reports it is getting better but not all the way healed.     HPI  Patient presents for dm follow up.  Taking medications as ordered but also feels that rybelsus  is not curbing her appetite.    Patient endorses that most of her meals are fast food, she does not cook or meal prep, goes to restaurants.  Does not limit carb intake or monitor blood sugars. States that she uses eating as her coping for all things.  Eats all of wrong things and little of the right things. Currently in therapy weekly, feels that this is improving other aspects in her life but has not worked on diet and exercise consistency at this time. Drinks water daily.   Diane Chaney   Has bilateral ankle edema daily, states that this is just how her legs are, can not remember when they were smaller.    Diabetes She presents for her follow-up diabetic visit. She has type 2 diabetes mellitus. Her disease course has been fluctuating. There are no hypoglycemic associated symptoms. Pertinent negatives for hypoglycemia include no headaches. There are no diabetic  associated symptoms. Pertinent negatives for diabetes include no blurred vision, no fatigue, no polydipsia, no polyphagia, no polyuria and no weakness. There are no hypoglycemic complications. Symptoms are improving. There are no diabetic complications. Risk factors for coronary artery disease include diabetes mellitus, obesity and sedentary lifestyle. Current diabetic treatment includes oral agent (dual therapy). She is compliant with treatment all of the time. Her weight is increasing steadily. She is following a generally unhealthy diet. When asked about meal planning, she reported none. She has not had a previous visit with a dietitian. She rarely participates in exercise. She does not see a podiatrist.Eye exam is not current.     Past Medical History:  Diagnosis Date   Hyperglycemia      History reviewed. No pertinent family history.   Current Outpatient Medications:    acetaminophen (TYLENOL) 500 MG tablet, Take 1,000 mg by mouth every 6 (six) hours as needed for moderate pain., Disp: , Rfl:    atorvastatin  (LIPITOR) 20 MG tablet, TAKE 1 TABLET BY MOUTH EVERY DAY, Disp: 90 tablet, Rfl: 1   BD PEN NEEDLE MICRO U/F 32G X 6 MM MISC, USE AS DIRECTED, Disp: 100 each, Rfl: 5   blood glucose meter kit and supplies KIT, Dispense based on patient and insurance preference. Use up to four times daily as directed. (FOR ICD-9 250.00, 250.01)., Disp: 1 each, Rfl: 0  insulin degludec  (TRESIBA  FLEXTOUCH) 100 UNIT/ML FlexTouch Pen, INJECT 20 UNITS INTO THE SKIN DAILY, Disp: 15 mL, Rfl: 3   losartan  (COZAAR ) 25 MG tablet, TAKE 1 TABLET BY MOUTH EVERY DAY, Disp: 90 tablet, Rfl: 0   Multiple Vitamin (MULTIVITAMIN WITH MINERALS) TABS tablet, Take 1 tablet by mouth daily., Disp: , Rfl:    tirzepatide (MOUNJARO) 5 MG/0.5ML Pen, Inject 5 mg into the skin once a week., Disp: 0.5 mL, Rfl: 1   furosemide  (LASIX ) 20 MG tablet, TAKE 1 TABLET (20 MG TOTAL) BY MOUTH DAILY. X 3 DAYS THEN AS NEEDED FOR SWELLING (Patient  not taking: Reported on 09/12/2023), Disp: 90 tablet, Rfl: 2   No Known Allergies   Review of Systems  Constitutional:  Negative for chills, fatigue and fever.  Eyes:  Negative for blurred vision.  Gastrointestinal:  Negative for constipation, diarrhea, nausea and vomiting.  Endocrine: Negative for polydipsia, polyphagia and polyuria.  Musculoskeletal:  Positive for joint swelling (bilateral ankles).  Neurological:  Negative for weakness, numbness and headaches.     Today's Vitals   09/12/23 1157  BP: 120/70  Pulse: 95  Temp: 98.7 F (37.1 C)  TempSrc: Oral  Weight: 233 lb 12.8 oz (106.1 kg)  Height: 5' 8 (1.727 m)  PainSc: 0-No pain   Body mass index is 35.55 kg/m.  Wt Readings from Last 3 Encounters:  09/12/23 233 lb 12.8 oz (106.1 kg)  05/15/23 230 lb 9.6 oz (104.6 kg)  01/23/23 223 lb 3.2 oz (101.2 kg)      Objective:  Physical Exam Constitutional:      General: She is not in acute distress.    Appearance: She is obese.  HENT:     Head: Normocephalic and atraumatic.  Neck:     Vascular: No carotid bruit.   Cardiovascular:     Rate and Rhythm: Normal rate and regular rhythm.     Pulses: Normal pulses.     Heart sounds: Normal heart sounds.  Pulmonary:     Effort: Pulmonary effort is normal.     Breath sounds: Normal breath sounds.   Musculoskeletal:     Right lower leg: Edema present.     Left lower leg: Edema present.   Skin:    Capillary Refill: Capillary refill takes less than 2 seconds.   Neurological:     General: No focal deficit present.     Mental Status: She is alert and oriented to person, place, and time. Mental status is at baseline.   Psychiatric:        Mood and Affect: Mood normal.        Behavior: Behavior normal.        Thought Content: Thought content normal.        Judgment: Judgment normal.      Title   Diabetic Foot Exam - detailed Is there a history of foot ulcer?: No Is there a foot ulcer now?: No Is there swelling?:  Yes Is there elevated skin temperature?: No Is there abnormal foot shape?: No Is there a claw toe deformity?: No Are the toenails long?: No Are the toenails thick?: No Are the toenails ingrown?: No Is the skin thin, fragile, shiny and hairless?: No Normal Range of Motion?: No Is there foot or ankle muscle weakness?: No Do you have pain in calf while walking?: No Are the shoes appropriate in style and fit?: Yes Can the patient see the bottom of their feet?: Yes Pulse Foot Exam completed.: Yes   Right  Posterior Tibialis: Present Left posterior Tibialis: Present   Right Dorsalis Pedis: Present Left Dorsalis Pedis: Present     Sensory Foot Exam Completed.: Yes Semmes-Weinstein Monofilament Test + means has sensation and - means no sensation   R Site 1-Great Toe: Pos L Site 1-Great Toe: Pos   R site 5: Pos L Site 5: Pos  R Site 6: Pos L Site 6: Pos     Image components are not supported.   Image components are not supported. Image components are not supported.  [place] Comments Decreased sensation at 8 and 9 bilaterally          Assessment And Plan:  Type 2 diabetes mellitus with obesity (HCC) Assessment & Plan: Patient encouraged to increase physical activity and follow a carb modified diet.  Hgb A1C drawn, foot exam performed.  Mounjaro 5 mg added for patient to start in July and stop rybelsus    Orders: -     Hemoglobin A1c -     Mounjaro; Inject 5 mg into the skin once a week.  Dispense: 0.5 mL; Refill: 1  Mixed hyperlipidemia Assessment & Plan: Cholesterol levels are stable.  Continue current medications.  Orders: -     BMP8+eGFR -     Lipid panel  COVID-19 vaccination declined Assessment & Plan: Declines covid 19 vaccine. Discussed risk of covid 23 and if she changes her mind about the vaccine to call the office. Education has been provided regarding the importance of this vaccine but patient still declined. Advised may receive this vaccine at  local pharmacy or Health Dept.or vaccine clinic. Aware to provide a copy of the vaccination record if obtained from local pharmacy or Health Dept.  Encouraged to take multivitamin, vitamin d , vitamin c and zinc to increase immune system. Aware can call office if would like to have vaccine here at office. Verbalized acceptance and understanding.    Class 2 obesity due to excess calories with body mass index (BMI) of 35.0 to 35.9 in adult, unspecified whether serious comorbidity present Assessment & Plan: Patient encouraged to increase physical activity to 150 minutes per week and to incorporate a diet low in carbohydrates and fats and high in fiber and protein.       Return for controlled DM check 4 months.  Patient was given opportunity to ask questions. Patient verbalized understanding of the plan and was able to repeat key elements of the plan. All questions were answered to their satisfaction.   I have reviewed this encounter including the documentation in this note and/or discussed this patient with Delon Louder, FNP Student. I am certifying that I agree with the content of this note as the primary care nurse practitioner.  Gaines Ada, DNP, FNP-BC  I, Gaines Ada, FNP, have reviewed all documentation for this visit. The documentation on 09/12/23 for the exam, diagnosis, procedures, and orders are all accurate and complete.    IF YOU HAVE BEEN REFERRED TO A SPECIALIST, IT MAY TAKE 1-2 WEEKS TO SCHEDULE/PROCESS THE REFERRAL. IF YOU HAVE NOT HEARD FROM US /SPECIALIST IN TWO WEEKS, PLEASE GIVE US  A CALL AT [phone].

## 2023-09-12 NOTE — Assessment & Plan Note (Signed)
 Patient encouraged to increase physical activity to 150 minutes per week and to incorporate a diet low in carbohydrates and fats and high in fiber and protein.

## 2023-09-12 NOTE — Assessment & Plan Note (Signed)
 Patient encouraged to increase physical activity and follow a carb modified diet.  Hgb A1C drawn, foot exam performed.  Mounjaro 5 mg added for patient to start in July and stop rybelsus 

## 2023-09-13 LAB — BMP8+EGFR
BUN/Creatinine Ratio: 13 (ref 9–23)
BUN: 9 mg/dL (ref 6–24)
CO2: 20 mmol/L (ref 20–29)
Calcium: 9.5 mg/dL (ref 8.7–10.2)
Chloride: 106 mmol/L (ref 96–106)
Creatinine, Ser: 0.71 mg/dL (ref 0.57–1.00)
Glucose: 93 mg/dL (ref 70–99)
Potassium: 4.2 mmol/L (ref 3.5–5.2)
Sodium: 142 mmol/L (ref 134–144)
eGFR: 105 mL/min/{1.73_m2} (ref >59)

## 2023-09-13 LAB — LIPID PANEL
Chol/HDL Ratio: 3.2 ratio (ref 0.0–4.4)
Cholesterol, Total: 175 mg/dL (ref 100–199)
HDL: 54 mg/dL (ref >39)
LDL Chol Calc (NIH): 102 mg/dL — ABNORMAL HIGH (ref 0–99)
Triglycerides: 104 mg/dL (ref 0–149)
VLDL Cholesterol Cal: 19 mg/dL (ref 5–40)

## 2023-09-13 LAB — HEMOGLOBIN A1C
Est. average glucose Bld gHb Est-mCnc: 197 mg/dL
Hgb A1c MFr Bld: 8.5 % — ABNORMAL HIGH (ref 4.8–5.6)

## 2023-09-14 ENCOUNTER — Ambulatory Visit: Admitting: Nurse Practitioner

## 2023-09-17 ENCOUNTER — Ambulatory Visit: Payer: Self-pay | Admitting: Nurse Practitioner

## 2023-09-17 MED ORDER — MOUNJARO 5 MG/0.5ML ~~LOC~~ SOAJ: 5.0000 mg | SUBCUTANEOUS | 1 refills | Status: DC

## 2023-09-17 NOTE — Assessment & Plan Note (Signed)
 Cholesterol levels are stable. Continue current medications

## 2023-09-17 NOTE — Assessment & Plan Note (Signed)

## 2023-10-05 ENCOUNTER — Other Ambulatory Visit: Payer: Self-pay | Admitting: Nurse Practitioner

## 2023-10-05 DIAGNOSIS — Z1231 Encounter for screening mammogram for malignant neoplasm of breast: Secondary | ICD-10-CM

## 2023-10-10 ENCOUNTER — Ambulatory Visit
Admission: RE | Admit: 2023-10-10 | Discharge: 2023-10-10 | Disposition: A | Discharge status: Home / Self Care | Payer: Self-pay | Source: Ambulatory Visit | Attending: Nurse Practitioner | Admitting: Nurse Practitioner

## 2023-10-10 DIAGNOSIS — Z1231 Encounter for screening mammogram for malignant neoplasm of breast: Secondary | ICD-10-CM

## 2023-10-19 ENCOUNTER — Other Ambulatory Visit: Payer: Self-pay | Admitting: Nurse Practitioner

## 2023-10-19 ENCOUNTER — Telehealth: Payer: Self-pay

## 2023-10-19 DIAGNOSIS — E1169 Type 2 diabetes mellitus with other specified complication: Secondary | ICD-10-CM

## 2023-10-19 MED ORDER — DEXCOM G7 SENSOR MISC: 2 refills | Status: DC

## 2023-10-19 NOTE — Telephone Encounter (Signed)
 Copied from CRM #[id number]. Topic: Clinical - Medication Question >> Oct 19, 2023 11:38 AM Leonette SQUIBB wrote: Reason for CRM: pt called wanting to know whether or not if her insurance will cover a prickless glucose tester.  She does not want to have to prick her skin to get a reading.  LVM FOR PT TO CALL BACK- Agam Tuohy,CMA

## 2023-10-25 ENCOUNTER — Telehealth: Payer: Self-pay | Admitting: Nurse Practitioner

## 2023-10-25 NOTE — Telephone Encounter (Signed)
 Copied from CRM [id number]. Topic: Clinical - Prescription Issue >> Oct 25, 2023  4:14 PM Jasmin G wrote: Reason for CRM: Pt was told by preferred pharmacy ([facility] [phone] - [place], [place] - [address]) that her medication refill for tirzepatide  (MOUNJARO ) 5 MG/0.5ML was cancelled, she is unsure as to why and is in need of this medication by Monday, she would also like to check on the status of her Pen Continuous Glucose Sensor (DEXCOM G7 SENSOR) MISC, as she was told by Insurance that PCP needs to give prior authorization or fill out an insurance form for it, please call pt back at [phone], she also mentioned that she would prefer for these to be sent to [id number] - [place], [place] - [address]

## 2023-10-26 ENCOUNTER — Other Ambulatory Visit: Payer: Self-pay

## 2023-10-26 DIAGNOSIS — E669 Obesity, unspecified: Secondary | ICD-10-CM

## 2023-10-26 MED ORDER — MOUNJARO 5 MG/0.5ML ~~LOC~~ SOAJ: 5.0000 mg | SUBCUTANEOUS | 1 refills | Status: DC

## 2023-10-26 MED ORDER — DEXCOM G7 SENSOR MISC: 2 refills | Status: DC

## 2023-11-14 ENCOUNTER — Other Ambulatory Visit: Payer: Self-pay | Admitting: Nurse Practitioner

## 2023-11-14 DIAGNOSIS — E1165 Type 2 diabetes mellitus with hyperglycemia: Secondary | ICD-10-CM

## 2023-12-24 ENCOUNTER — Other Ambulatory Visit: Payer: Self-pay | Admitting: Nurse Practitioner

## 2023-12-24 DIAGNOSIS — E119 Type 2 diabetes mellitus without complications: Secondary | ICD-10-CM

## 2023-12-26 ENCOUNTER — Other Ambulatory Visit: Payer: Self-pay | Admitting: Nurse Practitioner

## 2023-12-26 DIAGNOSIS — E669 Obesity, unspecified: Secondary | ICD-10-CM

## 2024-01-03 ENCOUNTER — Ambulatory Visit: Admitting: Nurse Practitioner

## 2024-01-03 ENCOUNTER — Encounter: Payer: Self-pay | Admitting: Nurse Practitioner

## 2024-01-03 VITALS — BP 120/74 | HR 80 | Temp 98.9°F | Ht 68.0 in | Wt 227.2 lb

## 2024-01-03 DIAGNOSIS — E119 Type 2 diabetes mellitus without complications: Secondary | ICD-10-CM

## 2024-01-03 DIAGNOSIS — E66811 Obesity, class 1: Secondary | ICD-10-CM | POA: Diagnosis not present

## 2024-01-03 DIAGNOSIS — E782 Mixed hyperlipidemia: Secondary | ICD-10-CM

## 2024-01-03 DIAGNOSIS — E669 Obesity, unspecified: Secondary | ICD-10-CM

## 2024-01-03 DIAGNOSIS — E6609 Other obesity due to excess calories: Secondary | ICD-10-CM

## 2024-01-03 DIAGNOSIS — Z139 Encounter for screening, unspecified: Secondary | ICD-10-CM

## 2024-01-03 DIAGNOSIS — Z2821 Immunization not carried out because of patient refusal: Secondary | ICD-10-CM

## 2024-01-03 DIAGNOSIS — Z6834 Body mass index (BMI) 34.0-34.9, adult: Secondary | ICD-10-CM

## 2024-01-03 MED ORDER — DEXCOM G7 SENSOR MISC: 2 refills | Status: AC

## 2024-01-03 MED ORDER — MOUNJARO 7.5 MG/0.5ML ~~LOC~~ SOAJ: 7.5000 mg | SUBCUTANEOUS | 1 refills | Status: AC

## 2024-01-03 NOTE — Progress Notes (Signed)
 Esmeralda Kristeen JINNY Gladis, CMA,acting as a neurosurgeon for Gaines Ada, FNP.,have documented all relevant documentation on the behalf of Gaines Ada, FNP,as directed by  Gaines Ada, FNP while in the presence of Gaines Ada, FNP.  Subjective:  Patient ID: Diane Chaney , female    DOB: 1976/02/15 , 48 y.o.   MRN: [id number]  Chief Complaint  Patient presents with   Diabetes    Patient presents today for a dm follow up, Patient reports compliance with medication. Patient denies any chest pain, SOB, or headaches. Patient has no concerns today.      HPI  Discussed the use of AI scribe software for clinical note transcription with the patient, who gave verbal consent to proceed.  History of Present Illness Diane Chaney is a 48 year old female with diabetes who presents for a follow-up visit.  She has two doses of Mounjaro  left and is unsure about increasing the dose. She started Mounjaro  at her last visit and is tolerating it well. She did not feel the injection when she administered it in her abdomen and was unsure if she received the dose, but noted that the plunger was fully depressed. She has experienced a weight loss of six pounds since her last visit and finds Mounjaro  more effective than Rybelsus  in controlling her appetite.  She is currently unable to monitor her blood glucose levels as her Dexcom has not arrived, and her other glucose monitor supplies are expired. She has requested a Dexcom, but there is an issue with the prescription process, and she is awaiting further communication.  Her last recorded A1c was 8.5.   Past Medical History:  Diagnosis Date   Hyperglycemia      History reviewed. No pertinent family history.   Current Outpatient Medications:    acetaminophen (TYLENOL) 500 MG tablet, Take 1,000 mg by mouth every 6 (six) hours as needed for moderate pain., Disp: , Rfl:    atorvastatin  (LIPITOR) 20 MG tablet, TAKE 1 TABLET BY MOUTH EVERY DAY, Disp: 90 tablet, Rfl:  1   BD PEN NEEDLE MICRO U/F 32G X 6 MM MISC, USE AS DIRECTED, Disp: 100 each, Rfl: 5   blood glucose meter kit and supplies KIT, Dispense based on patient and insurance preference. Use up to four times daily as directed. (FOR ICD-9 250.00, 250.01)., Disp: 1 each, Rfl: 0   insulin degludec  (TRESIBA  FLEXTOUCH) 100 UNIT/ML FlexTouch Pen, INJECT 20 UNITS INTO THE SKIN DAILY, Disp: 2 mL, Rfl: 3   losartan  (COZAAR ) 25 MG tablet, TAKE 1 TABLET BY MOUTH EVERY DAY, Disp: 90 tablet, Rfl: 0   Multiple Vitamin (MULTIVITAMIN WITH MINERALS) TABS tablet, Take 1 tablet by mouth daily., Disp: , Rfl:    tirzepatide  (MOUNJARO ) 7.5 MG/0.5ML Pen, Inject 7.5 mg into the skin once a week., Disp: 6 mL, Rfl: 1   Continuous Glucose Sensor (DEXCOM G7 SENSOR) MISC, Use as directed to monitor Blood sugars., Disp: 4 each, Rfl: 2   furosemide  (LASIX ) 20 MG tablet, TAKE 1 TABLET (20 MG TOTAL) BY MOUTH DAILY. X 3 DAYS THEN AS NEEDED FOR SWELLING (Patient not taking: Reported on 01/03/2024), Disp: 90 tablet, Rfl: 2   No Known Allergies   Review of Systems   Today's Vitals   01/03/24 1554  BP: 120/74  Pulse: 80  Temp: 98.9 F (37.2 C)  TempSrc: Oral  Weight: 227 lb 3.2 oz (103.1 kg)  Height: 5' 8 (1.727 m)  PainSc: 0-No pain   Body mass index is 34.55 kg/m.  Wt Readings from Last 3 Encounters:  01/03/24 227 lb 3.2 oz (103.1 kg)  09/12/23 233 lb 12.8 oz (106.1 kg)  05/15/23 230 lb 9.6 oz (104.6 kg)      Objective:  Physical Exam Vitals and nursing note reviewed.  Constitutional:      General: She is not in acute distress.    Appearance: Normal appearance. She is obese.  HENT:     Head: Normocephalic and atraumatic.  Neck:     Vascular: No carotid bruit.  Cardiovascular:     Rate and Rhythm: Normal rate and regular rhythm.     Pulses: Normal pulses.     Heart sounds: Normal heart sounds.  Pulmonary:     Effort: Pulmonary effort is normal. No respiratory distress.     Breath sounds: Normal breath  sounds. No wheezing.  Musculoskeletal:     Right lower leg: No edema.     Left lower leg: No edema.  Skin:    Capillary Refill: Capillary refill takes less than 2 seconds.  Neurological:     General: No focal deficit present.     Mental Status: She is alert and oriented to person, place, and time. Mental status is at baseline.  Psychiatric:        Mood and Affect: Mood normal.        Behavior: Behavior normal.        Thought Content: Thought content normal.        Judgment: Judgment normal.        Assessment And Plan:  Type 2 diabetes mellitus in patient with obesity (HCC) Assessment & Plan: Type 2 diabetes with recent Tirzepatide  initiation. Inadequate glucose monitoring due to Dexcom prescription issues. Weight decreased by six pounds. Previous hemoglobin A1c 8.5%. - Increase Tirzepatide  to next dose. - Resend Dexcom prescription to pharmacy. - Recheck hemoglobin A1c. - Order CMP. - Instruct her to contact via MyChart if Dexcom unavailable within a week.  Orders: -     Hemoglobin A1c -     Dexcom G7 Sensor; Use as directed to monitor Blood sugars.  Dispense: 4 each; Refill: 2 -     Lipid panel -     Mounjaro ; Inject 7.5 mg into the skin once a week.  Dispense: 6 mL; Refill: 1 -     CMP14+EGFR  Influenza vaccination declined  Mixed hyperlipidemia Assessment & Plan: Cholesterol levels are stable.  Continue current medications.  Orders: -     CMP14+EGFR  Class 1 obesity due to excess calories without serious comorbidity with body mass index (BMI) of 34.0 to 34.9 in adult Assessment & Plan: She is encouraged to strive for BMI less than 30 to decrease cardiac risk. Advised to aim for at least 150 minutes of exercise per week.    Encounter for screening -     Hepatitis B surface antibody,qualitative   Return for keep same next.  Patient was given opportunity to ask questions. Patient verbalized understanding of the plan and was able to repeat key elements of the  plan. All questions were answered to their satisfaction.    LILLETTE Gaines Ada, FNP, have reviewed all documentation for this visit. The documentation on 01/03/24 for the exam, diagnosis, procedures, and orders are all accurate and complete.   IF YOU HAVE BEEN REFERRED TO A SPECIALIST, IT MAY TAKE 1-2 WEEKS TO SCHEDULE/PROCESS THE REFERRAL. IF YOU HAVE NOT HEARD FROM US /SPECIALIST IN TWO WEEKS, PLEASE GIVE US  A CALL AT [phone].

## 2024-01-04 ENCOUNTER — Encounter: Payer: Self-pay | Admitting: Nurse Practitioner

## 2024-01-04 LAB — HEMOGLOBIN A1C
Est. average glucose Bld gHb Est-mCnc: 163 mg/dL
Hgb A1c MFr Bld: 7.3 % — ABNORMAL HIGH (ref 4.8–5.6)

## 2024-01-04 LAB — CMP14+EGFR
ALT: 16 IU/L (ref 0–32)
AST: 15 IU/L (ref 0–40)
Albumin: 4.2 g/dL (ref 3.9–4.9)
Alkaline Phosphatase: 92 IU/L (ref 41–116)
BUN/Creatinine Ratio: 19 (ref 9–23)
BUN: 11 mg/dL (ref 6–24)
Bilirubin Total: 0.3 mg/dL (ref 0.0–1.2)
CO2: 20 mmol/L (ref 20–29)
Calcium: 9.3 mg/dL (ref 8.7–10.2)
Chloride: 104 mmol/L (ref 96–106)
Creatinine, Ser: 0.58 mg/dL (ref 0.57–1.00)
Globulin, Total: 3.1 g/dL (ref 1.5–4.5)
Glucose: 85 mg/dL (ref 70–99)
Potassium: 4.4 mmol/L (ref 3.5–5.2)
Sodium: 141 mmol/L (ref 134–144)
Total Protein: 7.3 g/dL (ref 6.0–8.5)
eGFR: 112 mL/min/1.73 (ref >59)

## 2024-01-04 LAB — LIPID PANEL
Chol/HDL Ratio: 3.6 ratio (ref 0.0–4.4)
Cholesterol, Total: 182 mg/dL (ref 100–199)
HDL: 50 mg/dL (ref >39)
LDL Chol Calc (NIH): 114 mg/dL — ABNORMAL HIGH (ref 0–99)
Triglycerides: 98 mg/dL (ref 0–149)
VLDL Cholesterol Cal: 18 mg/dL (ref 5–40)

## 2024-01-04 LAB — HEPATITIS B SURFACE ANTIBODY,QUALITATIVE: Hep B Surface Ab, Qual: NONREACTIVE

## 2024-01-08 ENCOUNTER — Telehealth: Payer: Self-pay

## 2024-01-08 NOTE — Telephone Encounter (Signed)
PA for dexcom sent to plan.

## 2024-01-14 ENCOUNTER — Ambulatory Visit: Payer: Self-pay | Admitting: Nurse Practitioner

## 2024-01-14 DIAGNOSIS — E669 Obesity, unspecified: Secondary | ICD-10-CM | POA: Insufficient documentation

## 2024-01-14 NOTE — Assessment & Plan Note (Signed)
 She is encouraged to strive for BMI less than 30 to decrease cardiac risk. Advised to aim for at least 150 minutes of exercise per week.

## 2024-01-14 NOTE — Assessment & Plan Note (Signed)
 Cholesterol levels are stable. Continue current medications

## 2024-01-14 NOTE — Assessment & Plan Note (Signed)
 Type 2 diabetes with recent Tirzepatide  initiation. Inadequate glucose monitoring due to Dexcom prescription issues. Weight decreased by six pounds. Previous hemoglobin A1c 8.5%. - Increase Tirzepatide  to next dose. - Resend Dexcom prescription to pharmacy. - Recheck hemoglobin A1c. - Order CMP. - Instruct her to contact via MyChart if Dexcom unavailable within a week.

## 2024-02-13 ENCOUNTER — Other Ambulatory Visit: Payer: Self-pay | Admitting: Nurse Practitioner

## 2024-02-13 DIAGNOSIS — E1165 Type 2 diabetes mellitus with hyperglycemia: Secondary | ICD-10-CM

## 2024-02-14 ENCOUNTER — Other Ambulatory Visit: Payer: Self-pay | Admitting: Nurse Practitioner

## 2024-02-23 ENCOUNTER — Other Ambulatory Visit: Payer: Self-pay | Admitting: Nurse Practitioner

## 2024-02-23 DIAGNOSIS — E119 Type 2 diabetes mellitus without complications: Secondary | ICD-10-CM

## 2024-02-23 NOTE — Telephone Encounter (Unsigned)
 Copied from CRM [id number]. Topic: Clinical - Medication Refill >> Feb 23, 2024 12:41 PM Selinda RAMAN wrote: Medication: blood glucose meter kit and supplies KIT  Has the patient contacted their pharmacy? No   This is the patient's preferred pharmacy:  [id number] [address] [place], [place] - [address] [place] [address] Phone: [phone] Fax: [phone]   Is this the correct pharmacy for this prescription? Yes If no, delete pharmacy and type the correct one.   Has the prescription been filled recently? No  Is the patient out of the medication? Yes  Has the patient been seen for an appointment in the last year OR does the patient have an upcoming appointment? Yes  Can we respond through MyChart? No she would prefer a call or text  Please assist patient further

## 2024-03-01 MED ORDER — BLOOD GLUCOSE MONITOR KIT: PACK | 0 refills | Status: DC

## 2024-03-12 ENCOUNTER — Other Ambulatory Visit: Payer: Self-pay | Admitting: Nurse Practitioner

## 2024-03-12 DIAGNOSIS — E119 Type 2 diabetes mellitus without complications: Secondary | ICD-10-CM

## 2024-03-12 NOTE — Telephone Encounter (Signed)
 Copied from CRM #[id number]. Topic: Clinical - Medication Refill >> Mar 12, 2024 11:20 AM Charlet HERO wrote: Medication: blood glucose meter kit and supplies KIT  Has the patient contacted their pharmacy? Yes 0 refill  This is the patient's preferred pharmacy:  [id number] [place], [place] - [address] [place] [address] Phone: [phone] Fax: [phone]   Is this the correct pharmacy for this prescription? Yes If no, delete pharmacy and type the correct one.   Has the prescription been filled recently? Yes  Is the patient out of the medication? Yes  Has the patient been seen for an appointment in the last year OR does the patient have an upcoming appointment? Yes  Can we respond through MyChart? Yes  Agent: Please be advised that Rx refills may take up to 3 business days. We ask that you follow-up with your pharmacy.

## 2024-03-15 MED ORDER — BLOOD GLUCOSE MONITOR KIT: PACK | 0 refills | Status: DC

## 2024-03-22 ENCOUNTER — Telehealth: Payer: Self-pay

## 2024-03-22 NOTE — Telephone Encounter (Signed)
 Copied from CRM [id number]. Topic: Clinical - Prescription Issue >> Mar 22, 2024 11:26 AM Antwanette L wrote: Reason for CRM: [facility] reports that [facility] not [facility] the order for the blood glucose meter kit and supplies. According to the patient's chart, the order was submitted on 03/15/24. The patient can be reached at [phone]

## 2024-03-27 ENCOUNTER — Other Ambulatory Visit: Payer: Self-pay

## 2024-03-27 DIAGNOSIS — E119 Type 2 diabetes mellitus without complications: Secondary | ICD-10-CM

## 2024-03-27 MED ORDER — BLOOD GLUCOSE MONITOR KIT: PACK | 0 refills | Status: DC

## 2024-04-04 ENCOUNTER — Other Ambulatory Visit: Payer: Self-pay

## 2024-04-04 DIAGNOSIS — E1165 Type 2 diabetes mellitus with hyperglycemia: Secondary | ICD-10-CM

## 2024-04-04 DIAGNOSIS — E119 Type 2 diabetes mellitus without complications: Secondary | ICD-10-CM

## 2024-04-04 MED ORDER — BLOOD GLUCOSE MONITOR KIT: PACK | 0 refills | Status: AC

## 2024-04-04 MED ORDER — LANCETS MISC: 1.0000 | 0 refills | Status: AC

## 2024-04-04 MED ORDER — BLOOD GLUCOSE TEST VI STRP: 1.0000 | ORAL_STRIP | Freq: Three times a day (TID) | 0 refills | Status: AC

## 2024-04-04 MED ORDER — BLOOD GLUCOSE MONITORING SUPPL DEVI: 1.0000 | Freq: Three times a day (TID) | 0 refills | Status: AC

## 2024-04-04 MED ORDER — BLOOD GLUCOSE MONITOR KIT: PACK | 0 refills | Status: DC

## 2024-04-04 MED ORDER — LANCET DEVICE MISC: 1.0000 | Freq: Three times a day (TID) | 0 refills | Status: AC

## 2024-05-15 ENCOUNTER — Encounter: Payer: 59 | Admitting: Nurse Practitioner
